# Patient Record
Sex: Male | Born: 1955 | Race: White | Hispanic: No | Marital: Married | Smoking: Never smoker
Health system: Southern US, Community
[De-identification: ages and names within clinical notes are randomized; demographics above are authoritative.]

## PROBLEM LIST (undated history)

## (undated) DIAGNOSIS — E785 Hyperlipidemia, unspecified: Secondary | ICD-10-CM

## (undated) DIAGNOSIS — F419 Anxiety disorder, unspecified: Secondary | ICD-10-CM

## (undated) HISTORY — DX: Hyperlipidemia, unspecified: E78.5

## (undated) HISTORY — PX: PECTORALIS MAJOR REPAIR: SHX2182

## (undated) HISTORY — PX: OTHER SURGICAL HISTORY: SHX169

---

## 1998-11-11 ENCOUNTER — Emergency Department (HOSPITAL_COMMUNITY): Admission: EM | Admit: 1998-11-11 | Discharge: 1998-11-11 | Payer: Self-pay | Admitting: Emergency Medicine

## 1999-05-25 ENCOUNTER — Encounter: Admission: RE | Admit: 1999-05-25 | Discharge: 1999-07-13 | Payer: Self-pay | Admitting: Orthopaedic Surgery

## 2002-05-19 ENCOUNTER — Encounter: Payer: Self-pay | Admitting: Internal Medicine

## 2002-05-19 ENCOUNTER — Encounter: Admission: RE | Admit: 2002-05-19 | Discharge: 2002-05-19 | Payer: Self-pay | Admitting: Internal Medicine

## 2004-08-12 ENCOUNTER — Emergency Department (HOSPITAL_COMMUNITY): Admission: EM | Admit: 2004-08-12 | Discharge: 2004-08-13 | Payer: Self-pay

## 2004-08-19 ENCOUNTER — Emergency Department (HOSPITAL_COMMUNITY): Admission: EM | Admit: 2004-08-19 | Discharge: 2004-08-19 | Payer: Self-pay | Admitting: Internal Medicine

## 2011-01-03 ENCOUNTER — Ambulatory Visit
Admission: RE | Admit: 2011-01-03 | Discharge: 2011-01-03 | Payer: Self-pay | Source: Home / Self Care | Attending: Sports Medicine | Admitting: Sports Medicine

## 2011-01-03 DIAGNOSIS — M545 Low back pain, unspecified: Secondary | ICD-10-CM | POA: Insufficient documentation

## 2011-01-07 ENCOUNTER — Encounter
Admission: RE | Admit: 2011-01-07 | Discharge: 2011-01-07 | Payer: Self-pay | Source: Home / Self Care | Attending: Sports Medicine | Admitting: Sports Medicine

## 2011-01-31 NOTE — Assessment & Plan Note (Signed)
Summary: NP,LOWER BACK PAIN,MC   Vital Signs:  Patient profile:   55 year old male Height:      71 inches Weight:      195 pounds BMI:     27.30 Pulse rate:   58 / minute BP sitting:   110 / 75  (right arm)  Vitals Entered By: Rochele Pages RN (January 03, 2011 3:13 PM) CC: low back pain    CC:  low back pain .  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  been to [employer] already doing back series from there along with stretches this [place] has improved his back function  yoga slit helpful  exercises help for a few hours then tightness and irritation returns   now biggest limitation is stiffness and pain in low back  Preventive Screening-Counseling & Management  Alcohol-Tobacco     Smoking Status: never  Allergies (verified): No Known Drug Allergies  Social History: Smoking Status:  never  Physical Exam  General:  Well-developed,well-nourished,in no acute distress; alert,appropriate and cooperative throughout examination Msk:  heel, toe and tandem walk norm neg SLR bilat full flexion tight lat bending good rotation back extension limited to 20 deg and is uncomfortable FABER is limited bilat SI joints seem to move but tight  strength good without deficits   Impression & Recommendations:  Problem # 1:  LUMBAGO (ICD-724.2)  Orders: Diagnostic X-Ray/Fluoroscopy (Diagnostic X-Ray/Flu)  My concern is that he has a chronic condition that prob has an element of DDD and DJD will ck Xrays as history of back pain when young makes me concerned about spondylolisthesis  will prob need either amitriptyline at night or this w combo of tramadol in day  does well w ibuprofen so will try more regular dose of this first  will reck depending results of Xray  Complete Medication List: 1)  Paxil 20 Mg Tabs  (Paroxetine hcl) .... Take 1 by mouth once daily 2)  Propecia 1 Mg Tabs (Finasteride) .... Take 1 by mouth once daily 3)  Simvastatin 20 Mg Tabs (Simvastatin) .Marland Kitchen.. 1 by mouth qhs   Orders Added: 1)  Diagnostic X-Ray/Fluoroscopy [Diagnostic X-Ray/Flu] 2)  New Patient Level II [78295]

## 2011-02-11 ENCOUNTER — Ambulatory Visit (INDEPENDENT_AMBULATORY_CARE_PROVIDER_SITE_OTHER): Payer: BC Managed Care – PPO | Admitting: Family Medicine

## 2011-02-11 ENCOUNTER — Encounter: Payer: Self-pay | Admitting: Family Medicine

## 2011-02-11 DIAGNOSIS — S83419A Sprain of medial collateral ligament of unspecified knee, initial encounter: Secondary | ICD-10-CM

## 2011-02-20 NOTE — Assessment & Plan Note (Signed)
Summary: rt knee pain   Vital Signs:  Patient profile:   55 year old male Pulse rate:   61 / minute BP sitting:   91 / 64  Vitals Entered By: Lillia Pauls CMA (February 11, 2011 2:28 PM) CC: rt knee pain - injured during hockey   CC:  rt knee pain - injured during hockey.  History of Present Illness: right knee pain 1 month--fall while playing ice hockey. finished game and has been snow skiing since--has had pain with rolling over in bed or certain motions--getting better. Wants it checked out no prior knee injuries  Current Medications (verified): 1)  Paxil 20 Mg Tabs (Paroxetine Hcl) .... Take 1 By Mouth Once Daily 2)  Propecia 1 Mg Tabs (Finasteride) .... Take 1 By Mouth Once Daily 3)  Simvastatin 20 Mg Tabs (Simvastatin) .Marland Kitchen.. 1 By Mouth Qhs  Allergies: No Known Drug Allergies  Review of Systems  The patient denies fever.    Physical Exam  General:  alert, well-developed, well-nourished, and well-hydrated.   Msk:  R knee ttp over MCL. Very slight amount of opening on valgus stress (2 mm compared with other side). Ligamentously intact oyttherwise. noeffusion. FROM medial and lateral joint line nontender to palpation Additional Exam:  [place] (notcharged for this exam) small amount of edema around mcl but it apperas intact. quad and patellar tendons intact no nknee effusioon   Impression & Recommendations:  Problem # 1:  KNEE SPRAIN, RIGHT, MEDIAL COLLATERAL LIGAMENT (ICD-844.1) continue current brace given general knee rehab / strrengthening program rtc prn  Complete Medication List: 1)  Paxil 20 Mg Tabs (Paroxetine hcl) .... Take 1 by mouth once daily 2)  Propecia 1 Mg Tabs (Finasteride) .... Take 1 by mouth once daily 3)  Simvastatin 20 Mg Tabs (Simvastatin) .Marland Kitchen.. 1 by mouth qhs   Orders Added: 1)  Est. Patient Level III [16109]

## 2011-09-19 ENCOUNTER — Encounter: Payer: Self-pay | Admitting: Family Medicine

## 2011-09-19 ENCOUNTER — Ambulatory Visit (HOSPITAL_BASED_OUTPATIENT_CLINIC_OR_DEPARTMENT_OTHER)
Admission: RE | Admit: 2011-09-19 | Discharge: 2011-09-19 | Disposition: A | Discharge status: Home / Self Care | Payer: BC Managed Care – PPO | Source: Ambulatory Visit | Attending: Family Medicine | Admitting: Family Medicine

## 2011-09-19 ENCOUNTER — Ambulatory Visit (INDEPENDENT_AMBULATORY_CARE_PROVIDER_SITE_OTHER): Payer: BC Managed Care – PPO | Admitting: Family Medicine

## 2011-09-19 VITALS — BP 115/73 | HR 51 | Temp 97.6°F | Ht 71.0 in | Wt 194.2 lb

## 2011-09-19 DIAGNOSIS — M25529 Pain in unspecified elbow: Secondary | ICD-10-CM

## 2011-09-19 DIAGNOSIS — M25521 Pain in right elbow: Secondary | ICD-10-CM

## 2011-09-19 DIAGNOSIS — X58XXXA Exposure to other specified factors, initial encounter: Secondary | ICD-10-CM | POA: Insufficient documentation

## 2011-09-19 DIAGNOSIS — S42453A Displaced fracture of lateral condyle of unspecified humerus, initial encounter for closed fracture: Secondary | ICD-10-CM | POA: Insufficient documentation

## 2011-09-23 ENCOUNTER — Encounter: Payer: Self-pay | Admitting: Family Medicine

## 2011-09-23 DIAGNOSIS — M25521 Pain in right elbow: Secondary | ICD-10-CM | POA: Insufficient documentation

## 2011-09-23 NOTE — Assessment & Plan Note (Signed)
patient did not suffer a high impact injury or fall.  Only bony tenderness on exam is at radial head but radiographs are negative for a fracture here.  No joint effusion that would suggest a fracture and he is not tender at any of the three locations of questionable avulsion fractures.  Also with full motion which would be unusual with a fracture that would be sustained in elbow in past 1-2 weeks.  Exam and mechanism suggest capsular sprain.  Advised no hockey for 2 weeks, rest elbow from painful activities.  Icing, tylenol/aleve for pain. Use elbow sleeve which has helped him significantly.  If still not improving or worsening over next 2 weeks, advised him would return for evaluation and then consider further diagnostic imaging (likely MRI).  Call with any questions in the meantime.

## 2011-09-23 NOTE — Progress Notes (Signed)
  Subjective:    Patient ID: Brett Payne, male    DOB: 1956-08-07, 55 y.o.   MRN: [id number]  PCP: Dr. Kevan Ny  HPI 55 yo M here for right elbow pain.  Patient reports about 2 weeks ago while playing hockey he was moving toward the boards when he hyperextended his right elbow. Felt some soreness but was able to continue playing. Pain worse the next morning and radiates from elbow down forearm some.  Worst posterolateral elbow. Seems better with movement. 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 Bought an elbow sleeve that provides excellent compression and greatly alleviates the pain. Taking ibuprofen. Not icing or heating.  Past Medical History  Diagnosis Date  . Hyperlipidemia     No current outpatient prescriptions on file prior to visit.    Past Surgical History  Procedure Date  . Pectoralis major repair     No Known Allergies  History   Social History  . Marital Status: Married    Spouse Name: N/A    Number of Children: N/A  . Years of Education: N/A   Occupational History  . Not on file.   Social History Main Topics  . Smoking status: Never Smoker   . Smokeless tobacco: Not on file  . Alcohol Use: Not on file  . Drug Use: Not on file  . Sexually Active: Not on file   Other Topics Concern  . Not on file   Social History Narrative  . No narrative on file    Family History  Problem Relation Age of Onset  . Hyperlipidemia Mother   . Diabetes Brother   . Heart attack Neg Hx   . Hypertension Neg Hx   . Sudden death Neg Hx     BP 115/73  Pulse 51  Temp(Src) 97.6 F (36.4 C) (Oral)  Ht 5\' 11"  (1.803 m)  Wt 194 lb 3.2 oz (88.089 kg)  BMI 27.09 kg/m2  Review of Systems See HPI above.    Objective:   Physical Exam Gen: NAD  R elbow: No gross deformity, swelling, or bruising. TTP at radial head.  No medial/lateral epicondyle, olecranon, or other bony TTP.  Mild TTP  extensor mass of forearm. Full flexion, extension, supination, and pronation of elbow. Able to flex and extend with 5/5 strength. Collateral ligaments intact. NVI distally.     Assessment & Plan:  1. Right elbow pain - patient did not suffer a high impact injury or fall.  Only bony tenderness on exam is at radial head but radiographs are negative for a fracture here.  No joint effusion that would suggest a fracture and he is not tender at any of the three locations of questionable avulsion fractures.  Also with full motion which would be unusual with a fracture that would be sustained in elbow in past 1-2 weeks.  Exam and mechanism suggest capsular sprain.  Advised no hockey for 2 weeks, rest elbow from painful activities.  Icing, tylenol/aleve for pain. Use elbow sleeve which has helped him significantly.  If still not improving or worsening over next 2 weeks, advised him would return for evaluation and then consider further diagnostic imaging (likely MRI).  Call with any questions in the meantime.

## 2011-10-07 ENCOUNTER — Encounter: Payer: Self-pay | Admitting: Family Medicine

## 2011-10-07 ENCOUNTER — Ambulatory Visit (INDEPENDENT_AMBULATORY_CARE_PROVIDER_SITE_OTHER): Payer: BC Managed Care – PPO | Admitting: Family Medicine

## 2011-10-07 VITALS — BP 114/64 | Ht 71.0 in | Wt 185.0 lb

## 2011-10-07 DIAGNOSIS — M25521 Pain in right elbow: Secondary | ICD-10-CM

## 2011-10-07 DIAGNOSIS — M25529 Pain in unspecified elbow: Secondary | ICD-10-CM

## 2011-10-07 NOTE — Patient Instructions (Signed)
Verbal instructions: wait 1-2 weeks before returning to hockey.  Wear elbow sleeve until painless then only with sports for 3-4 weeks. If persists or worsens, swelling/bruising, advised to follow-up for reevaluation

## 2011-10-07 NOTE — Assessment & Plan Note (Addendum)
Consistent with capsular sprain.  Significantly improved to this point with full motion and strength.  Only has pain with certain movements - advised to wait 1-2 weeks before returning to hockey, preferably when pain is completely resolved.  Icing, tylenol/aleve as needed.  Follow-up as needed.

## 2011-10-07 NOTE — Progress Notes (Signed)
  Subjective:    Patient ID: Brett Payne, male    DOB: 12-27-1956, 55 y.o.   MRN: [id number]  PCP: Dr. Kevan Ny  HPI  55 yo M here for f/u right elbow pain.  9/20: Patient reports about 2 weeks ago while playing hockey he was moving toward the boards when he hyperextended his right elbow. Felt some soreness but was able to continue playing. Pain worse the next morning and radiates from elbow down forearm some.  Worst posterolateral elbow. Seems better with movement. 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 Bought an elbow sleeve that provides excellent compression and greatly alleviates the pain. Taking ibuprofen. Not icing or heating.  10/8: Patient reports he is at least 75% improved. Now only occasionally gets pain with certain movements. Pain resolves with elbow sleeve as well. Continues with work which involves a lot of manual labor. No longer icing or taking medications. Has not returned to hockey yet.  Past Medical History  Diagnosis Date  . Hyperlipidemia     Current Outpatient Prescriptions on File Prior to Visit  Medication Sig Dispense Refill  . mometasone (ELOCON) 0.1 % ointment       . PARoxetine (PAXIL) 40 MG tablet       . simvastatin (ZOCOR) 40 MG tablet         Past Surgical History  Procedure Date  . Pectoralis major repair     No Known Allergies  History   Social History  . Marital Status: Married    Spouse Name: N/A    Number of Children: N/A  . Years of Education: N/A   Occupational History  . Not on file.   Social History Main Topics  . Smoking status: Never Smoker   . Smokeless tobacco: Not on file  . Alcohol Use: Not on file  . Drug Use: Not on file  . Sexually Active: Not on file   Other Topics Concern  . Not on file   Social History Narrative  . No narrative on file    Family History  Problem Relation Age of Onset  . Hyperlipidemia Mother   .  Diabetes Brother   . Heart attack Neg Hx   . Hypertension Neg Hx   . Sudden death Neg Hx     BP 114/64  Ht 5\' 11"  (1.803 m)  Wt 185 lb (83.915 kg)  BMI 25.80 kg/m2  Review of Systems  See HPI above.    Objective:   Physical Exam  Gen: NAD  R elbow: No gross deformity, swelling, or bruising. Minimal TTP at radial head.  No medial/lateral epicondyle, olecranon, or other bony TTP.  No TTP extensor mass of forearm. Full flexion, extension, supination, and pronation of elbow. Able to flex and extend with 5/5 strength. Collateral ligaments intact. NVI distally.     Assessment & Plan:  1. Right elbow pain - Consistent with capsular sprain.  Significantly improved to this point with full motion and strength.  Only has pain with certain movements - advised to wait 1-2 weeks before returning to hockey, preferably when pain is completely resolved.  Icing, tylenol/aleve as needed.  Follow-up as needed.

## 2012-03-24 ENCOUNTER — Encounter: Payer: Self-pay | Admitting: Family Medicine

## 2012-03-24 ENCOUNTER — Ambulatory Visit (INDEPENDENT_AMBULATORY_CARE_PROVIDER_SITE_OTHER): Payer: BC Managed Care – PPO | Admitting: Family Medicine

## 2012-03-24 VITALS — BP 110/74 | HR 71 | Temp 98.2°F | Ht 71.0 in | Wt 190.0 lb

## 2012-03-24 DIAGNOSIS — M79609 Pain in unspecified limb: Secondary | ICD-10-CM

## 2012-03-24 DIAGNOSIS — M79676 Pain in unspecified toe(s): Secondary | ICD-10-CM

## 2012-03-24 NOTE — Patient Instructions (Signed)
When you have pain at this joint you're dealing with one of four issues: gout, arthritis, turf toe, synovitis. You do not have gout. 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 Turf toe is the other likely possibility - this and arthritis of that joint are treated similarly. Avoid painful activities when possible though it's ok to continue playing sports - especially ones like hockey, skiing, snowboarding where you are wearing a hard soled shoe to begin with. Ice after activities for 15 minutes at a time. Tylenol 500mg  1-2 tabs three times a day as needed for pain Ibuprofen 600mg  three times a day with food as needed for pain. Capsaicin, flexall 454, and aspercreme are topical medications you can use to help with pain as well (each one is up to 4 times a day). The other hallmark of treatment is trying to immobilize this as much as possible by one of several methods: walking boot, hard-soled shoe (postop shoe), graphite insert or steel shank, temporary inserts with great toe lift/cushion to prevent dorsiflexion of that toe, and/or taping to your 2nd toe. Follow up with me as needed.

## 2012-03-24 NOTE — Progress Notes (Signed)
  Subjective:    Patient ID: Brett Payne, male    DOB: July 07, 1956, 56 y.o.   MRN: [id number]  PCP: Dr. Robley Fries  HPI 56 yo M here for left great toe pain.  Patient denies known injury. He states that over the past few weeks after getting new shoes he started to have great toe pain about the 1st MTP worse with a lot of activity and at the end of the day. No pain during hockey or skiing (has hard shoes with this) but worse after he takes these off. No swelling, redness, prior injury. Son was diagnosed with turf toe previously and he believes this is what he has.  Past Medical History  Diagnosis Date  . Hyperlipidemia     Current Outpatient Prescriptions on File Prior to Visit  Medication Sig Dispense Refill  . mometasone (ELOCON) 0.1 % ointment       . PARoxetine (PAXIL) 40 MG tablet       . simvastatin (ZOCOR) 40 MG tablet         Past Surgical History  Procedure Date  . Pectoralis major repair     No Known Allergies  History   Social History  . Marital Status: Married    Spouse Name: N/A    Number of Children: N/A  . Years of Education: N/A   Occupational History  . Not on file.   Social History Main Topics  . Smoking status: Never Smoker   . Smokeless tobacco: Not on file  . Alcohol Use: Not on file  . Drug Use: Not on file  . Sexually Active: Not on file   Other Topics Concern  . Not on file   Social History Narrative  . No narrative on file    Family History  Problem Relation Age of Onset  . Hyperlipidemia Mother   . Diabetes Brother   . Heart attack Neg Hx   . Hypertension Neg Hx   . Sudden death Neg Hx     BP 110/74  Pulse 71  Temp(Src) 98.2 F (36.8 C) (Oral)  Ht 5\' 11"  (1.803 m)  Wt 190 lb (86.183 kg)  BMI 26.50 kg/m2  Review of Systems See HPI above.    Objective:   Physical Exam Gen: NAD  L foot: No gross deformity, swelling, bruising, erythema or warmth. Mild TTP dorsal aspect of 1st MTP joint.  No other TTP about  foot or ankle. Mod limitation of dorsiflexion of 1st MTP - pain on full dorsiflexion. FROM other digits, ankle. Strength 5/5 ankle motions without pain. NVI distally. No hallux valgus.    Assessment & Plan:  1. 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  He would like to try postop shoe for around the house with toe taping, comforthotics with 1st ray post in running shoes for when he's on his feet a lot - both provided today.  Discussed icing, relative rest, otc meds.  See instructions for further.  No evidence that this is gouty flare.

## 2012-03-24 NOTE — Assessment & Plan Note (Signed)
likely 2/2 arthritic flare of this joint, less likely turf toe (no acute injury).  He would like to try postop shoe for around the house with toe taping, comforthotics with 1st ray post in running shoes for when he's on his feet a lot - both provided today.  Discussed icing, relative rest, otc meds.  See instructions for further.  No evidence that this is gouty flare.

## 2012-06-02 ENCOUNTER — Ambulatory Visit (INDEPENDENT_AMBULATORY_CARE_PROVIDER_SITE_OTHER): Payer: BC Managed Care – PPO | Admitting: Family Medicine

## 2012-06-02 VITALS — BP 98/60 | Ht 71.0 in | Wt 190.0 lb

## 2012-06-02 DIAGNOSIS — S86919A Strain of unspecified muscle(s) and tendon(s) at lower leg level, unspecified leg, initial encounter: Secondary | ICD-10-CM

## 2012-06-02 DIAGNOSIS — IMO0002 Reserved for concepts with insufficient information to code with codable children: Secondary | ICD-10-CM

## 2012-06-02 NOTE — Patient Instructions (Signed)
Treadmill: Lateral Calf Rehab  Forward Walking: Go light 2 mins, easy about 2-3 mph: At Sideways Left: 2 mins, 0.6 - 0.8 mph Sideways Right: 2 mins, 0.6 - 0.8 mph Backwards, 2 mins, 1.8 - 2.2 mph Repeat, several cycles Goal is 30 minute  Start at a 3 degree incline - can slightly lower if necessary When able, increase to 3.5, then 4, then 4.5 (After you comfortably can do 30 minutes)  When you can do this fully without pain, then can do some light running but start out easy  Running progression  10) Start with 20 min walk 1 min /run 1 min  11) Advance by 5 mins per week  12. Advance to running 2 min, walk 1 min. Then 3/1, 4/1, 5/1 -- All as tolerated based on pain.  Increase total running volume by no more than 10% per week  12) Please schedule a follow-up appointment in 1 month.

## 2012-06-03 NOTE — Progress Notes (Signed)
   [facility] [address] [place], [place] [address] Phone: [phone] Fax: [phone]   Patient Name: Brett Payne Lacks Date of Birth: 07-05-1956 Medical Record Number: [id number] Gender: male Date of Encounter: 06/02/2012  History of Present Illness:  Brylin Stanislawski Mcphillips is a 56 y.o. very pleasant male patient who presents with the following:  Pleasant gentleman presents with lateral LE pain, with running that began about 3 weeks ago. Also plays hockey which does not bother it. Not a specific injury per se, but will have episodes where will be ok for a mile or so, then feel a catch and have difficulty finishing his run.   Mostly all lateral, not in knee, not in posterior of knee. No prob on uneven surfaces.  Had a turf toe a few months ago, which resolved with a corticosteroid injection in the MTP joint.  Past Medical, Surgical, Social, and Family History Reviewed. Medications and Allergies reviewed and all updated if necessary.  Review of Systems:   GEN: No fevers, chills. Nontoxic. Primarily MSK c/o today. MSK: Detailed in the HPI GI: tolerating PO intake without difficulty Neuro: No numbness, parasthesias, or tingling associated. Otherwise the pertinent positives of the ROS are noted above.    Physical Examination: Filed Vitals:   06/02/12 1512  BP: 98/60   Filed Vitals:   06/02/12 1512  Height: 5\' 11"  (1.803 m)  Weight: 190 lb (86.183 kg)   Body mass index is 26.50 kg/(m^2).   GEN: WDWN, NAD, Non-toxic, Alert & Oriented x 3 HEENT: Atraumatic, Normocephalic.  Ears and Nose: No external deformity. EXTR: No clubbing/cyanosis/edema NEURO: Normal gait.  PSYCH: Normally interactive. Conversant. Not depressed or anxious appearing.  Calm demeanor.    Ant and Post drawer: neg Hip abduction, IR, ER: WNL Hip flexion str: 5/5 Hip abd: 5/5 Quad: 5/5 VMO atrophy:No Hamstring concentric and eccentric: 5/5 IROM and EROM resisted  5/5 with no pain  Lateral aspect of LE ttp in distinct muscle plane.   Assessment and Plan:  1. Muscle strain, lower leg    Reviewed rehab with him Lateral stability musculature Gait fairly normal  Rehab per p/i  Orders Today: No orders of the defined types were placed in this encounter.    Medications Today: No orders of the defined types were placed in this encounter.     Hannah Beat, MD

## 2012-06-25 ENCOUNTER — Ambulatory Visit (INDEPENDENT_AMBULATORY_CARE_PROVIDER_SITE_OTHER): Payer: BC Managed Care – PPO | Admitting: Family Medicine

## 2012-06-25 ENCOUNTER — Encounter: Payer: Self-pay | Admitting: Family Medicine

## 2012-06-25 VITALS — BP 106/66 | HR 62 | Ht 71.0 in | Wt 190.0 lb

## 2012-06-25 DIAGNOSIS — S86111A Strain of other muscle(s) and tendon(s) of posterior muscle group at lower leg level, right leg, initial encounter: Secondary | ICD-10-CM

## 2012-06-25 DIAGNOSIS — S838X9A Sprain of other specified parts of unspecified knee, initial encounter: Secondary | ICD-10-CM

## 2012-06-25 NOTE — Progress Notes (Signed)
  Subjective:    Patient ID: Brett Payne, male    DOB: 1956-09-14, 56 y.o.   MRN: [id number]  HPI  Mr. Brett Payne is a pleasant 56 year old male patient, come in today complaining of right calf which started while he was running with his wife last Saturday. He has not been running in a month and he decide to go running Saturday and afterwards mild he felt a sudden severe pain on the lateral calf of his right leg, he felt that the pain improved continue running and he continued running for another 3 miles. The pain got worse. He denies any numbness or tingling. He denies any hematomas or swelling of the right calf. He denies any prior injuries in the calf. The pain is sharp, 410 intensity, on and off, worse with weightbearing activity and walking. Improved by rest.  Patient Active Problem List  Diagnosis  . LUMBAGO  . KNEE SPRAIN, RIGHT, MEDIAL COLLATERAL LIGAMENT  . Right elbow pain  . Toe pain   Current Outpatient Prescriptions on File Prior to Visit  Medication Sig Dispense Refill  . mometasone (ELOCON) 0.1 % ointment       . PARoxetine (PAXIL) 40 MG tablet       . simvastatin (ZOCOR) 40 MG tablet        No Known Allergies    Review of Systems  Constitutional: Negative for fever, chills, diaphoresis and fatigue.  Cardiovascular: Negative for chest pain and leg swelling.  Musculoskeletal: Positive for gait problem.  Neurological: Negative for weakness and numbness.       Objective:   Physical Exam  Constitutional: He is oriented to person, place, and time. He appears well-developed and well-nourished.       BP 106/66  Pulse 62  Ht 5\' 11"  (1.803 m)  Wt 190 lb (86.183 kg)  BMI 26.50 kg/m2   Pulmonary/Chest: Effort normal.  Musculoskeletal:       Right leg and intact the skin There is tenderness to the patient in the mid third of the posterior lateral leg. Achilles tendon is intact. Thompson test is negative Pain with resisted month of flexion of the right  ankle. Neurovascularly intact  Neurological: He is alert and oriented to person, place, and time. He has normal reflexes.  Skin: Skin is warm. No rash noted. No erythema.  Psychiatric: He has a normal mood and affect. His behavior is normal. Thought content normal.    msk u/s : Right lateral gastroc with a 3 x 3 cm hypoechoic area on the lateral gastroc with a strain and tear of the gastroc muscle fibers. The Achilles tendon is intact. Medial gastroc is intact.      Assessment & Plan:   1. Strain of right gastrocnemius muscle    Ice massage x 20 minutes twice a day Body HELIX compression calf sleeve Rest From running for 2- 3 weeks Calf eccentric strengthening exercise protocol given to the patient Heel Lift place on both shoes for 4 weeks. Aleeve for pain control prn. F/U in 4 weeks prn.

## 2012-07-01 ENCOUNTER — Ambulatory Visit: Payer: BC Managed Care – PPO | Admitting: Sports Medicine

## 2013-10-06 ENCOUNTER — Encounter: Payer: Self-pay | Admitting: Podiatry

## 2013-10-07 ENCOUNTER — Encounter: Payer: Self-pay | Admitting: Podiatry

## 2013-10-07 ENCOUNTER — Ambulatory Visit (INDEPENDENT_AMBULATORY_CARE_PROVIDER_SITE_OTHER): Payer: BC Managed Care – PPO | Admitting: Podiatry

## 2013-10-07 ENCOUNTER — Ambulatory Visit (INDEPENDENT_AMBULATORY_CARE_PROVIDER_SITE_OTHER): Payer: BC Managed Care – PPO

## 2013-10-07 VITALS — BP 100/70 | HR 66 | Resp 12 | Ht 71.0 in | Wt 195.0 lb

## 2013-10-07 DIAGNOSIS — Z9889 Other specified postprocedural states: Secondary | ICD-10-CM

## 2013-10-07 DIAGNOSIS — Z472 Encounter for removal of internal fixation device: Secondary | ICD-10-CM

## 2013-10-07 NOTE — Progress Notes (Signed)
Subjective:     Patient ID: Brett Payne, male   DOB: Jan 29, 1956, 57 y.o.   MRN: [id number]  HPI patient presents stating my foot is okay. States he is not having pain.   Review of Systems  All other systems reviewed and are negative.       Objective:   Physical Exam  Constitutional: He is oriented to person, place, and time.  Cardiovascular: Intact distal pulses.   Neurological: He is oriented to person, place, and time.  Skin: Skin is warm.   Incision site dorsal left foot healing well stitches intact.    Assessment:     Well-healing surgical site left.    Plan:     X-rays reviewed. Stitches removed. Sterile dressing applied to the area which she will wear for one more week. Discharge and less problems

## 2014-04-05 ENCOUNTER — Other Ambulatory Visit: Payer: Self-pay

## 2014-04-05 ENCOUNTER — Ambulatory Visit (INDEPENDENT_AMBULATORY_CARE_PROVIDER_SITE_OTHER): Payer: BC Managed Care – PPO | Admitting: Physician Assistant

## 2014-04-05 ENCOUNTER — Encounter: Payer: Self-pay | Admitting: Interventional Cardiology

## 2014-04-05 DIAGNOSIS — R0609 Other forms of dyspnea: Secondary | ICD-10-CM

## 2014-04-05 DIAGNOSIS — R0989 Other specified symptoms and signs involving the circulatory and respiratory systems: Secondary | ICD-10-CM

## 2014-04-05 NOTE — Progress Notes (Signed)
Exercise Treadmill Test  Brett AlmRobert A Smaldone is a 58 y.o. male with a hx of HL is referred for ETT.  He denies a hx of HTN, DM, tobacco abuse.  No FHx of CAD.  He requested an ETT for screening purposes.  Exam is unremarkable.  ECG:  NSR, no ST changes.  Pre-Exercise Testing Evaluation Rhythm: normal sinus  Rate: 60 bpm     Test  Exercise Tolerance Test Ordering MD: Verdis PrimeHenry Smith, MD  Interpreting MD: Tereso NewcomerScott Mattye Verdone, PA-C  Unique Test No: 1  Treadmill:  1  Indication for ETT: exertional dyspnea  Contraindication to ETT: No   Stress Modality: exercise - treadmill  Cardiac Imaging Performed: non   Protocol: standard Bruce - maximal  Max BP:  135/75  Max MPHR (bpm):  164 85% MPR (bpm):  139  MPHR obtained (bpm):  142 % MPHR obtained:  86  Reached 85% MPHR (min:sec):  12:10 Total Exercise Time (min-sec):  13:06  Workload in METS:  15.4 Borg Scale: 15  Reason ETT Terminated:  patient's desire to stop    ST Segment Analysis At Rest: normal ST segments - no evidence of significant ST depression With Exercise: non-specific ST changes  Other Information Arrhythmia:  No Angina during ETT:  absent (0) Quality of ETT:  diagnostic  ETT Interpretation:  normal - no evidence of ischemia by ST analysis  Comments: Excellent exercise capacity. No chest pain. Normal BP response to exercise. No ST changes to suggest ischemia.   Recommendations: F/u with GATES,Oluwatobi NEVILL, MD as directed. Signed,  Tereso NewcomerScott Aviyanna Colbaugh, PA-C   04/05/2014 11:23 AM

## 2016-02-27 ENCOUNTER — Encounter: Payer: Self-pay | Admitting: Family Medicine

## 2016-02-27 ENCOUNTER — Ambulatory Visit (INDEPENDENT_AMBULATORY_CARE_PROVIDER_SITE_OTHER): Payer: BLUE CROSS/BLUE SHIELD | Admitting: Family Medicine

## 2016-02-27 VITALS — BP 103/74 | HR 69 | Ht 71.0 in | Wt 195.0 lb

## 2016-02-27 DIAGNOSIS — M25561 Pain in right knee: Secondary | ICD-10-CM

## 2016-02-27 DIAGNOSIS — M6580 Other synovitis and tenosynovitis, unspecified site: Secondary | ICD-10-CM | POA: Diagnosis not present

## 2016-02-27 DIAGNOSIS — M76899 Other specified enthesopathies of unspecified lower limb, excluding foot: Secondary | ICD-10-CM

## 2016-02-27 NOTE — Progress Notes (Signed)
Thedford Bunton Spallone - 60 y.o. male MRN [id number]  Date of birth: [birth year]  CC:  Right medial knee pain  SUBJECTIVE:   HPI  Mr. Sabino Dick linger is a very pleasant 60 year old male  Who runs about 4 times a week , about 2 miles from. He comes in 3 weeks after experiencing acute onset right knee pain following a run. He has tried various  Treatment modalities including occasional ibuprofen, icing twice a day, and bracing with a hinge knee brace. He feels relatively no pain when he awakes in the morning. As he goes through his day of the pain progressively worsens. He has not been running. He recently took a long walk with his wife and notice increased pain. He feels as though it is similar to a strain although he does not remember any significant injury. He does like to Trail run,  Which is what he was doing prior to the injury. He otherwise feels well with no fevers chills or night sweats. Later this week he is heading to [place] to go skiing.  ROS:      14 point review of systems negative other than that listed above in history of present illness regards to musculoskeletal complaint. Denies any significant fevers chills or night sweats. Denies any new rash.  HISTORY: Past Medical, Surgical, Social, and Family History Reviewed & Updated per EMR.  Pertinent Historical Findings include: negative for any previous knee injuries.  OBJECTIVE: BP 103/74 mmHg  Pulse 69  Ht  (1.803 m)  Wt 195 lb (88.451 kg)  BMI 27.21 kg/m2  Physical Exam   calm, no acute distress  nonlabored breathing  Knee: right Normal to inspection with no erythema or effusion or obvious bony abnormalities.  no joint line tenderness no medial or lateral patellar tenderness. Patient is sore 2-3 cm proximal from the medial anterior joint line along the medial femoral condyle. No bruising or soft tissue swelling identified. ROM normal in flexion and extension and lower leg rotation. Ligaments with solid consistent  endpoints including ACL, PCL, LCL, MCL. Negative Mcmurray's and  Thessaly  Non painful patellar compression. Patellar and quadriceps tendons unremarkable. Mild pain with resisted straight leg raise with external hip rotation. Mild pain with resisted hip adduction    ultrasound: Long and short axis views of the right knee were obtained and there is a mild to moderate sized joint effusion.  The quad and patellar tendons are unremarkable. Medial joint line has a slightly narrowed joint space with an adequately sized and uniformly echoic medial meniscus without any surrounding edema. Evaluation of the medial femoral condyle in the region of history and her internist does not reveal an obvious abnormality. There are several areas of hypoechoic change and increased vascularity potentially suspicious for a soft tissue injury.  MEDICATIONS, LABS & OTHER ORDERS: Previous Medications   FINASTERIDE (PROPECIA) 1 MG TABLET    Take by mouth.   MOMETASONE (ELOCON) 0.1 % OINTMENT       PAROXETINE (PAXIL) 40 MG TABLET       SIMVASTATIN (ZOCOR) 40 MG TABLET       Modified Medications   No medications on file   New Prescriptions   No medications on file   Discontinued Medications   No medications on file  No orders of the defined types were placed in this encounter.   ASSESSMENT & PLAN:  right knee pain : patient is a frequent runner who presents with a unique set of symptoms which seem consistent  with an adductor magnus strain. He did not have an acute injury but developed knee pain following a run around 3 weeks ago. Since that time he has not improved  despite relative rest,  Icing, and bracing. She is leaving for  [place] to go skiing later this week.  We have recommended he wear at least his hinged knee brace skiing, although there is risk he exacerbates his injury if he chooses to ski. He will wear his compression brace  at other times. He will work on hip adductor stretching and strengthening.  He will follow-up after his ski trip if she is having continued pain. At that time we can consider an intra-articular steroid injection to help with diagnosis as he did have a small effusion of uncertain significance.

## 2016-03-13 ENCOUNTER — Encounter: Payer: Self-pay | Admitting: Family Medicine

## 2016-03-13 ENCOUNTER — Ambulatory Visit (INDEPENDENT_AMBULATORY_CARE_PROVIDER_SITE_OTHER): Payer: BLUE CROSS/BLUE SHIELD | Admitting: Family Medicine

## 2016-03-13 VITALS — BP 109/76 | HR 57 | Ht 71.0 in | Wt 195.0 lb

## 2016-03-13 DIAGNOSIS — R269 Unspecified abnormalities of gait and mobility: Secondary | ICD-10-CM | POA: Diagnosis not present

## 2016-03-13 DIAGNOSIS — M23206 Derangement of unspecified meniscus due to old tear or injury, right knee: Secondary | ICD-10-CM

## 2016-03-13 DIAGNOSIS — M23306 Other meniscus derangements, unspecified meniscus, right knee: Secondary | ICD-10-CM

## 2016-03-13 NOTE — Assessment & Plan Note (Signed)
Obvious tear (degenerative) on ultrasound today and provocative testing is positive. -Expect this will respond well to conservative management as he has minimal swelling and minimal pain until deep flexion. -Continue with compression ice and Advil and activity modification. -If still having symptoms and to 3 weeks would recommend corticosteroid injection.

## 2016-03-13 NOTE — Assessment & Plan Note (Signed)
    Patient was fitted for a : standard, cushioned, semi-rigid orthotic. The orthotic was heated and afterward the patient stood on the orthotic blank positioned on the orthotic stand. The patient was positioned in subtalar neutral position and 10 degrees of ankle dorsiflexion in a weight bearing stance. After completion of molding, a stable base was applied to the orthotic blank. The blank was ground to a stable position for weight bearing. Size: 11 Base: Blue EVA Additional Posting and Padding: None The patient ambulated these, and they were very comfortable.  I spent 40 minutes with this patient, greater than 50% was face-to-face time counseling regarding the below diagnosis.   

## 2016-03-13 NOTE — Progress Notes (Signed)
  Brett AlmRobert A Marolf - 60 y.o. male MRN [id number]  Date of birth: 05/07/1956 Brett Payne is a 60 y.o. male who presents today for orthotics and R knee pain.  R knee pain = ongoing for 5 weeks now after a run. Began when he went to stand up after a run and had immediate pain in the medial joint. This improved over the following 2 weeks but he had a take time off from running. Denied any frank effusion. No previous injury. Pain is worse any twisting motion. He did go skiing 3 weeks ago was able do this for 2 days but then had an episode where he turned the wrong way and was unable to ski. He continues to have medial joint pain since then and has been unable to run. No paresthesias going down his leg. He has been wearing compression which is helped.  Abnormality of gait - patient with severe medial arch pain secondary to pes cavus. He also has collapse the transverse arch the proximal nail walks.  PMHx - Updated and reviewed.  Contributory factors include: Negative PSHx - Updated and reviewed.  Contributory factors include:  Negative FHx - Updated and reviewed.  Contributory factors include:  Negative Social Hx - Updated and reviewed. Contributory factors include: Retired nonsmoker  Medications - updated and reviewed   ROS Per HPI.  12 point negative other than per HPI.   Exam:  Filed Vitals:   03/13/16 1405  BP: 109/76  Pulse: 57   Gen: NAD, AAO 3 Cardio- RRR Pulm - Normal respiratory effort/rate Skin: No rashes or erythema Extremities: No edema  Vascular: pulses +2 bilateral upper and lower extremity Psych: Normal affect  Knee R:  Normal to inspection with no erythema or effusion or obvious bony abnormalities. TTp medial joint line ROM normal in flexion and extension and lower leg rotation. Ligaments with solid consistent endpoints including ACL, PCL, LCL, MCL. + Mcmurray's and provocative meniscal tests.  Imaging:  Bulging medial meniscus R knee with calcification and  hypoechoic split appearing to be degenerative.  Suprapatellar pouch with minimal hypoechoic/anoechoic fluid as well.

## 2016-03-28 ENCOUNTER — Encounter: Payer: Self-pay | Admitting: Family Medicine

## 2016-03-28 ENCOUNTER — Ambulatory Visit
Admission: RE | Admit: 2016-03-28 | Discharge: 2016-03-28 | Disposition: A | Discharge status: Home / Self Care | Payer: BLUE CROSS/BLUE SHIELD | Source: Ambulatory Visit | Attending: Family Medicine | Admitting: Family Medicine

## 2016-03-28 ENCOUNTER — Ambulatory Visit (INDEPENDENT_AMBULATORY_CARE_PROVIDER_SITE_OTHER): Payer: BLUE CROSS/BLUE SHIELD | Admitting: Family Medicine

## 2016-03-28 VITALS — BP 93/63 | HR 75 | Ht 71.0 in | Wt 195.0 lb

## 2016-03-28 DIAGNOSIS — M23206 Derangement of unspecified meniscus due to old tear or injury, right knee: Secondary | ICD-10-CM

## 2016-03-28 DIAGNOSIS — M23306 Other meniscus derangements, unspecified meniscus, right knee: Secondary | ICD-10-CM

## 2016-03-28 MED ORDER — METHYLPREDNISOLONE ACETATE 40 MG/ML IJ SUSP
40.0000 mg | Freq: Once | INTRAMUSCULAR | Status: AC
  Administered 2016-03-28: 40 mg via INTRA_ARTICULAR

## 2016-03-28 NOTE — Assessment & Plan Note (Signed)
Continues to have pain 2/2 most likely meniscal tear.   - Will set up 4 view x-rays of R knee and MRI to evaluate extent - CSI today - Consider PRP/Stem cell in 4-6 weeks pending results from steroid injection/MRI

## 2016-03-28 NOTE — Progress Notes (Signed)
  Brett Payne - 60 y.o. male MRN [id number]  Date of birth: 02/14/1956 Brett Payne is a 60 y.o. male who presents today for R knee pain.  R knee pain 03/13/16 = ongoing for 5 weeks now after a run. Began when he went to stand up after a run and had immediate pain in the medial joint. This improved over the following 2 weeks but he had a take time off from running. Denied any frank effusion. No previous injury. Pain is worse any twisting motion. He did go skiing 3 weeks ago was able do this for 2 days but then had an episode where he turned the wrong way and was unable to ski. He continues to have medial joint pain since then and has been unable to run. No paresthesias going down his leg. He has been wearing compression which is helped.  R knee meniscus injury 03/28/16 - Pt continues to have pain without any new injury.  Pain worse with flexion of the knee and now having occasional catching in the R medial knee.  Has had some swelling as well.    PMHx - Updated and reviewed.  Contributory factors include: Negative PSHx - Updated and reviewed.  Contributory factors include:  Negative FHx - Updated and reviewed.  Contributory factors include:  Negative Social Hx - Updated and reviewed. Contributory factors include: Retired nonsmoker  Medications - updated and reviewed   ROS Per HPI.  12 point negative other than per HPI.   Exam:  Filed Vitals:   03/28/16 1003  BP: 93/63  Pulse: 75   Gen: NAD, AAO 3 Cardio- RRR Pulm - Normal respiratory effort/rate Skin: No rashes or erythema Extremities: No edema  Vascular: pulses +2 bilateral upper and lower extremity Psych: Normal affect  Knee R:  Normal to inspection with no erythema or effusion or obvious bony abnormalities. TTp medial joint line ROM normal in flexion and extension and lower leg rotation. Ligaments with solid consistent endpoints including ACL, PCL, LCL, MCL. + Mcmurray's and provocative meniscal tests.  Imaging:    Bulging medial meniscus R knee with calcification and hypoechoic split appearing to be degenerative.  Suprapatellar pouch with minimal hypoechoic/anoechoic fluid as well.

## 2016-04-01 DIAGNOSIS — F4321 Adjustment disorder with depressed mood: Secondary | ICD-10-CM | POA: Diagnosis not present

## 2016-04-02 ENCOUNTER — Other Ambulatory Visit: Payer: Self-pay | Admitting: *Deleted

## 2016-04-04 ENCOUNTER — Telehealth: Payer: Self-pay | Admitting: Family Medicine

## 2016-04-04 ENCOUNTER — Ambulatory Visit
Admission: RE | Admit: 2016-04-04 | Discharge: 2016-04-04 | Disposition: A | Discharge status: Home / Self Care | Payer: BLUE CROSS/BLUE SHIELD | Source: Ambulatory Visit | Attending: Family Medicine | Admitting: Family Medicine

## 2016-04-04 DIAGNOSIS — S83411D Sprain of medial collateral ligament of right knee, subsequent encounter: Secondary | ICD-10-CM

## 2016-04-04 DIAGNOSIS — M23306 Other meniscus derangements, unspecified meniscus, right knee: Secondary | ICD-10-CM

## 2016-04-04 DIAGNOSIS — S83241A Other tear of medial meniscus, current injury, right knee, initial encounter: Secondary | ICD-10-CM | POA: Diagnosis not present

## 2016-04-04 NOTE — Telephone Encounter (Signed)
Discussed results with pt.  Referral to ortho for arthroscopy with possible chondroplasty/menisectomy vs partial knee.  Pt amenable to this plan.  Thanks, Judie GrieveBryan

## 2016-04-05 NOTE — Addendum Note (Signed)
Addended by: Annita BrodMOORE, Easter Schinke C on: 04/05/2016 08:51 AM   Modules accepted: Orders

## 2016-04-05 NOTE — Telephone Encounter (Signed)
[facility] Hurshel KeysPete Dalldorf Wed. Apr. 19th at [facility]4pm [address], [place], [place] [address] [phone]

## 2016-04-08 DIAGNOSIS — M25561 Pain in right knee: Secondary | ICD-10-CM | POA: Diagnosis not present

## 2016-04-08 DIAGNOSIS — F4321 Adjustment disorder with depressed mood: Secondary | ICD-10-CM | POA: Diagnosis not present

## 2016-04-15 DIAGNOSIS — F4321 Adjustment disorder with depressed mood: Secondary | ICD-10-CM | POA: Diagnosis not present

## 2016-04-17 DIAGNOSIS — M25561 Pain in right knee: Secondary | ICD-10-CM | POA: Diagnosis not present

## 2016-04-18 ENCOUNTER — Encounter (HOSPITAL_BASED_OUTPATIENT_CLINIC_OR_DEPARTMENT_OTHER): Payer: Self-pay | Admitting: *Deleted

## 2016-04-18 ENCOUNTER — Other Ambulatory Visit: Payer: Self-pay | Admitting: Orthopaedic Surgery

## 2016-04-22 DIAGNOSIS — F4321 Adjustment disorder with depressed mood: Secondary | ICD-10-CM | POA: Diagnosis not present

## 2016-04-22 NOTE — H&P (Signed)
Brett Payne is an 60 y.o. male.    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  He is here about his right knee.  About 3 months ago he developed some pain while running.  This has persisted.  He has a constant severe pain on the inside aspect of the knee.  This does not typically wake him from sleep.  He initially saw Dr. Darrick Penna who did an ultrasound which was suggestive of a meniscus tear.  He received an injection which did not afford him much relief.  He has been treated with a brace.  He eventually underwent an MRI scan which shows a meniscus tear.  Imaging/Tests: I reviewed an MRI scan films and [facility].  This shows a medial meniscus tear in the posterior horn with a paucity of degenerative change.  Past Medical History  Diagnosis Date  . Hyperlipidemia   . Anxiety     Past Surgical History  Procedure Laterality Date  . Pectoralis major repair    . Bone spur      Family History  Problem Relation Age of Onset  . Hyperlipidemia Mother   . Diabetes Brother   . Heart attack Neg Hx   . Hypertension Neg Hx   . Sudden death Neg Hx    Social History:  reports that he has never smoked. He has never used smokeless tobacco. He reports that he does not drink alcohol or use illicit drugs.  Allergies: No Known Allergies  No prescriptions prior to admission    No results found for this or any previous visit (from the past 48 hour(s)). No results found.  Review of Systems  Musculoskeletal: Positive for joint pain.       Right knee  All other systems reviewed and are negative.   Height  (1.803 m), weight 86.183 kg (190 lb). Physical Exam  Constitutional: He is oriented to person, place, and time. He appears well-developed and well-nourished.  HENT:  Head: Normocephalic and atraumatic.  Eyes: Pupils  are equal, round, and reactive to light.  Neck: Normal range of motion.  Cardiovascular: Normal rate and regular rhythm.   Respiratory: Effort normal.  GI: Soft.  Musculoskeletal:  Right knee has no effusion.  He has some intense medial joint line pain.  Motion is full and he has pain at both extremes.  McMurray's test is positive for pain.  I feel no crepitation.  Ligaments are stable.   Hip motion is full and painfree and SLR is negative on both sides.  There is no palpable LAD behind either knee.  Sensation and motor function are intact on both sides and there are palpable pulses on both sides.    Neurological: He is alert and oriented to person, place, and time.  Skin: Skin is warm and dry.  Psychiatric: He has a normal mood and affect. His behavior is normal. Judgment and thought content normal.     Assessment/Plan Assessment: Right knee torn medial meniscus by MRI  Plan: It looks as though Brett Payne is headed towards a knee arthroscopy.  He's had an injection and has worn a brace and has tolerated this for 3 months.  He is unable to run which is what he likes to do for exercise. I reviewed risks of anesthesia and infection as  well as potential for DVT related to a knee arthroscopy.  I've stressed the importance of some postoperative physical therapy to optimize results and we will try to set up an appointment.  Two to four  weeks for recovery would be typical but that is a little variable.    Rayan Ines, Ginger OrganNDREW PAUL, PA-C 04/22/2016, 2:40 PM

## 2016-04-26 ENCOUNTER — Encounter (HOSPITAL_BASED_OUTPATIENT_CLINIC_OR_DEPARTMENT_OTHER)
Admission: RE | Disposition: A | Discharge status: Home / Self Care | Payer: Self-pay | Source: Ambulatory Visit | Attending: Orthopaedic Surgery

## 2016-04-26 ENCOUNTER — Encounter (HOSPITAL_BASED_OUTPATIENT_CLINIC_OR_DEPARTMENT_OTHER): Payer: Self-pay

## 2016-04-26 ENCOUNTER — Ambulatory Visit (HOSPITAL_BASED_OUTPATIENT_CLINIC_OR_DEPARTMENT_OTHER): Payer: BLUE CROSS/BLUE SHIELD | Admitting: Certified Registered"

## 2016-04-26 ENCOUNTER — Ambulatory Visit (HOSPITAL_BASED_OUTPATIENT_CLINIC_OR_DEPARTMENT_OTHER)
Admission: RE | Admit: 2016-04-26 | Discharge: 2016-04-26 | Disposition: A | Discharge status: Home / Self Care | Payer: BLUE CROSS/BLUE SHIELD | Source: Ambulatory Visit | Attending: Orthopaedic Surgery | Admitting: Orthopaedic Surgery

## 2016-04-26 DIAGNOSIS — Z79899 Other long term (current) drug therapy: Secondary | ICD-10-CM | POA: Insufficient documentation

## 2016-04-26 DIAGNOSIS — E785 Hyperlipidemia, unspecified: Secondary | ICD-10-CM | POA: Diagnosis not present

## 2016-04-26 DIAGNOSIS — X58XXXA Exposure to other specified factors, initial encounter: Secondary | ICD-10-CM | POA: Diagnosis not present

## 2016-04-26 DIAGNOSIS — S83241A Other tear of medial meniscus, current injury, right knee, initial encounter: Secondary | ICD-10-CM | POA: Diagnosis not present

## 2016-04-26 DIAGNOSIS — M2241 Chondromalacia patellae, right knee: Secondary | ICD-10-CM | POA: Diagnosis not present

## 2016-04-26 DIAGNOSIS — M94261 Chondromalacia, right knee: Secondary | ICD-10-CM | POA: Diagnosis not present

## 2016-04-26 HISTORY — DX: Anxiety disorder, unspecified: F41.9

## 2016-04-26 HISTORY — PX: KNEE ARTHROSCOPY WITH MEDIAL MENISECTOMY: SHX5651

## 2016-04-26 HISTORY — PX: CHONDROPLASTY: SHX5177

## 2016-04-26 SURGERY — ARTHROSCOPY, KNEE, WITH MEDIAL MENISCECTOMY
Anesthesia: General | Site: Knee | Laterality: Right

## 2016-04-26 MED ORDER — MIDAZOLAM HCL 5 MG/5ML IJ SOLN
INTRAMUSCULAR | Status: DC | PRN
  Administered 2016-04-26: 2 mg via INTRAVENOUS

## 2016-04-26 MED ORDER — BUPIVACAINE HCL (PF) 0.5 % IJ SOLN
INTRAMUSCULAR | Status: AC
  Filled 2016-04-26: qty 30

## 2016-04-26 MED ORDER — BUPIVACAINE-EPINEPHRINE (PF) 0.5% -1:200000 IJ SOLN
INTRAMUSCULAR | Status: AC
  Filled 2016-04-26: qty 30

## 2016-04-26 MED ORDER — FENTANYL CITRATE (PF) 100 MCG/2ML IJ SOLN
50.0000 ug | INTRAMUSCULAR | Status: DC | PRN
  Administered 2016-04-26: 50 ug via INTRAVENOUS

## 2016-04-26 MED ORDER — FENTANYL CITRATE (PF) 100 MCG/2ML IJ SOLN
INTRAMUSCULAR | Status: AC
  Filled 2016-04-26: qty 2

## 2016-04-26 MED ORDER — DEXAMETHASONE SODIUM PHOSPHATE 4 MG/ML IJ SOLN
INTRAMUSCULAR | Status: DC | PRN
  Administered 2016-04-26: 10 mg via INTRAVENOUS

## 2016-04-26 MED ORDER — CEFAZOLIN SODIUM-DEXTROSE 2-4 GM/100ML-% IV SOLN
2.0000 g | INTRAVENOUS | Status: AC
  Administered 2016-04-26: 2 g via INTRAVENOUS

## 2016-04-26 MED ORDER — PROPOFOL 10 MG/ML IV BOLUS
INTRAVENOUS | Status: DC | PRN
  Administered 2016-04-26: 200 mg via INTRAVENOUS

## 2016-04-26 MED ORDER — GLYCOPYRROLATE 0.2 MG/ML IJ SOLN: 0.2000 mg | Freq: Once | INTRAMUSCULAR | Status: DC | PRN

## 2016-04-26 MED ORDER — DEXAMETHASONE SODIUM PHOSPHATE 10 MG/ML IJ SOLN
INTRAMUSCULAR | Status: AC
  Filled 2016-04-26: qty 1

## 2016-04-26 MED ORDER — HYDROMORPHONE HCL 1 MG/ML IJ SOLN
0.2500 mg | INTRAMUSCULAR | Status: DC | PRN
  Administered 2016-04-26 (×2): 0.5 mg via INTRAVENOUS

## 2016-04-26 MED ORDER — HYDROMORPHONE HCL 1 MG/ML IJ SOLN
INTRAMUSCULAR | Status: AC
  Filled 2016-04-26: qty 1

## 2016-04-26 MED ORDER — SCOPOLAMINE 1 MG/3DAYS TD PT72: 1.0000 | MEDICATED_PATCH | Freq: Once | TRANSDERMAL | Status: DC | PRN

## 2016-04-26 MED ORDER — METHYLPREDNISOLONE ACETATE 40 MG/ML IJ SUSP
INTRAMUSCULAR | Status: AC
  Filled 2016-04-26: qty 1

## 2016-04-26 MED ORDER — LACTATED RINGERS IV SOLN: INTRAVENOUS | Status: DC

## 2016-04-26 MED ORDER — METHYLPREDNISOLONE ACETATE 80 MG/ML IJ SUSP
INTRAMUSCULAR | Status: AC
  Filled 2016-04-26: qty 1

## 2016-04-26 MED ORDER — BUPIVACAINE-EPINEPHRINE 0.5% -1:200000 IJ SOLN
INTRAMUSCULAR | Status: DC | PRN
  Administered 2016-04-26: 6 mL

## 2016-04-26 MED ORDER — ONDANSETRON HCL 4 MG/2ML IJ SOLN
INTRAMUSCULAR | Status: AC
  Filled 2016-04-26: qty 2

## 2016-04-26 MED ORDER — LIDOCAINE HCL (CARDIAC) 20 MG/ML IV SOLN
INTRAVENOUS | Status: DC | PRN
  Administered 2016-04-26: 100 mg via INTRAVENOUS

## 2016-04-26 MED ORDER — HYDROCODONE-ACETAMINOPHEN 5-325 MG PO TABS
ORAL_TABLET | ORAL | Status: AC
  Filled 2016-04-26: qty 1

## 2016-04-26 MED ORDER — MORPHINE SULFATE (PF) 4 MG/ML IV SOLN
INTRAVENOUS | Status: DC | PRN
  Administered 2016-04-26: 4 mg

## 2016-04-26 MED ORDER — PROPOFOL 10 MG/ML IV BOLUS
INTRAVENOUS | Status: AC
  Filled 2016-04-26: qty 80

## 2016-04-26 MED ORDER — MIDAZOLAM HCL 2 MG/2ML IJ SOLN
INTRAMUSCULAR | Status: AC
  Filled 2016-04-26: qty 2

## 2016-04-26 MED ORDER — KETOROLAC TROMETHAMINE 30 MG/ML IJ SOLN
INTRAMUSCULAR | Status: AC
  Filled 2016-04-26: qty 1

## 2016-04-26 MED ORDER — MIDAZOLAM HCL 2 MG/2ML IJ SOLN: 1.0000 mg | INTRAMUSCULAR | Status: DC | PRN

## 2016-04-26 MED ORDER — CHLORHEXIDINE GLUCONATE 4 % EX LIQD: 60.0000 mL | Freq: Once | CUTANEOUS | Status: DC

## 2016-04-26 MED ORDER — PROMETHAZINE HCL 25 MG/ML IJ SOLN: 6.2500 mg | INTRAMUSCULAR | Status: DC | PRN

## 2016-04-26 MED ORDER — LACTATED RINGERS IV SOLN
INTRAVENOUS | Status: DC
  Administered 2016-04-26 (×2): via INTRAVENOUS

## 2016-04-26 MED ORDER — HYDROCODONE-ACETAMINOPHEN 5-325 MG PO TABS
1.0000 | ORAL_TABLET | Freq: Four times a day (QID) | ORAL | Status: DC | PRN
  Administered 2016-04-26: 1 via ORAL

## 2016-04-26 MED ORDER — SODIUM CHLORIDE 0.9 % IR SOLN
Status: DC | PRN
  Administered 2016-04-26: 2000 mL

## 2016-04-26 MED ORDER — CEFAZOLIN SODIUM-DEXTROSE 2-4 GM/100ML-% IV SOLN
INTRAVENOUS | Status: AC
  Filled 2016-04-26: qty 100

## 2016-04-26 MED ORDER — HYDROCODONE-ACETAMINOPHEN 5-325 MG PO TABS: 1.0000 | ORAL_TABLET | Freq: Four times a day (QID) | ORAL | Status: AC | PRN

## 2016-04-26 MED ORDER — MORPHINE SULFATE (PF) 4 MG/ML IV SOLN
INTRAVENOUS | Status: AC
  Filled 2016-04-26: qty 1

## 2016-04-26 SURGICAL SUPPLY — 39 items
BANDAGE ACE 6X5 VEL STRL LF (GAUZE/BANDAGES/DRESSINGS) ×3 IMPLANT
BLADE CUDA 5.5 (BLADE) IMPLANT
BLADE CUTTER GATOR 3.5 (BLADE) ×3 IMPLANT
BLADE GREAT WHITE 4.2 (BLADE) IMPLANT
BNDG GAUZE ELAST 4 BULKY (GAUZE/BANDAGES/DRESSINGS) ×3 IMPLANT
DRAPE ARTHROSCOPY W/POUCH 114 (DRAPES) ×3 IMPLANT
DRAPE IMP U-DRAPE 54X76 (DRAPES) ×3 IMPLANT
DRAPE U-SHAPE 47X51 STRL (DRAPES) ×3 IMPLANT
DRSG EMULSION OIL 3X3 NADH (GAUZE/BANDAGES/DRESSINGS) ×3 IMPLANT
DURAPREP 26ML APPLICATOR (WOUND CARE) ×6 IMPLANT
ELECT MENISCUS 165MM 90D (ELECTRODE) IMPLANT
ELECT REM PT RETURN 9FT ADLT (ELECTROSURGICAL)
ELECTRODE REM PT RTRN 9FT ADLT (ELECTROSURGICAL) IMPLANT
GAUZE SPONGE 4X4 12PLY STRL (GAUZE/BANDAGES/DRESSINGS) ×3 IMPLANT
GLOVE BIO SURGEON STRL SZ8 (GLOVE) ×6 IMPLANT
GLOVE BIOGEL PI IND STRL 7.0 (GLOVE) ×2 IMPLANT
GLOVE BIOGEL PI IND STRL 8 (GLOVE) ×4 IMPLANT
GLOVE BIOGEL PI INDICATOR 7.0 (GLOVE) ×2
GLOVE BIOGEL PI INDICATOR 8 (GLOVE) ×2
GLOVE ECLIPSE 6.5 STRL STRAW (GLOVE) ×2 IMPLANT
GOWN STRL REUS W/ TWL LRG LVL3 (GOWN DISPOSABLE) ×2 IMPLANT
GOWN STRL REUS W/ TWL XL LVL3 (GOWN DISPOSABLE) ×4 IMPLANT
GOWN STRL REUS W/TWL LRG LVL3 (GOWN DISPOSABLE) ×3
GOWN STRL REUS W/TWL XL LVL3 (GOWN DISPOSABLE) ×6
IV NS IRRIG 3000ML ARTHROMATIC (IV SOLUTION) ×3 IMPLANT
KNEE WRAP E Z 3 GEL PACK (MISCELLANEOUS) ×3 IMPLANT
MANIFOLD NEPTUNE II (INSTRUMENTS) ×2 IMPLANT
PACK ARTHROSCOPY DSU (CUSTOM PROCEDURE TRAY) ×3 IMPLANT
PACK BASIN DAY SURGERY FS (CUSTOM PROCEDURE TRAY) ×3 IMPLANT
PENCIL BUTTON HOLSTER BLD 10FT (ELECTRODE) IMPLANT
SET ARTHROSCOPY TUBING (MISCELLANEOUS) ×3
SET ARTHROSCOPY TUBING LN (MISCELLANEOUS) ×2 IMPLANT
SHEET MEDIUM DRAPE 40X70 STRL (DRAPES) ×3 IMPLANT
SYR 3ML 18GX1 1/2 (SYRINGE) IMPLANT
TOWEL OR 17X24 6PK STRL BLUE (TOWEL DISPOSABLE) ×3 IMPLANT
TOWEL OR NON WOVEN STRL DISP B (DISPOSABLE) ×3 IMPLANT
WAND 30 DEG SABER W/CORD (SURGICAL WAND) IMPLANT
WAND STAR VAC 90 (SURGICAL WAND) IMPLANT
WATER STERILE IRR 1000ML POUR (IV SOLUTION) ×3 IMPLANT

## 2016-04-26 NOTE — Anesthesia Postprocedure Evaluation (Addendum)
Anesthesia Post Note  Patient: Brett Payne  Procedure(s) Performed: Procedure(s) (LRB): KNEE ARTHROSCOPY WITH MEDIAL MENISECTOMY (Right) CHONDROPLASTY (Right)  Patient location during evaluation: PACU Anesthesia Type: General Level of consciousness: awake Pain management: pain level controlled Respiratory status: spontaneous breathing Cardiovascular status: stable Anesthetic complications: no    Last Vitals:  Filed Vitals:   04/26/16 1430 04/26/16 1453  BP: 100/74 109/74  Pulse: 51 49  Temp:  36.4 C  Resp: 12 18    Last Pain:  Filed Vitals:   04/26/16 1454  PainSc: 4                  EDWARDS,Uri Turnbough

## 2016-04-26 NOTE — Interval H&P Note (Signed)
OK for surgery PD 

## 2016-04-26 NOTE — Op Note (Signed)
#  [id number] 

## 2016-04-26 NOTE — Anesthesia Procedure Notes (Signed)
Procedure Name: LMA Insertion Date/Time: 04/26/2016 1:08 PM Performed by: Tyrone NineSAUVE, Aaron Boeh F Pre-anesthesia Checklist: Patient identified, Timeout performed, Emergency Drugs available, Suction available and Patient being monitored Patient Re-evaluated:Patient Re-evaluated prior to inductionOxygen Delivery Method: Circle system utilized Preoxygenation: Pre-oxygenation with 100% oxygen Intubation Type: IV induction Ventilation: Mask ventilation without difficulty LMA: LMA inserted LMA Size: 4.0 Number of attempts: 1 Placement Confirmation: breath sounds checked- equal and bilateral and positive ETCO2 Tube secured with: Tape Dental Injury: Teeth and Oropharynx as per pre-operative assessment

## 2016-04-26 NOTE — Anesthesia Preprocedure Evaluation (Addendum)
Anesthesia Evaluation  Patient identified by MRN, date of birth, ID band Patient awake    Reviewed: Allergy & Precautions, NPO status , Patient's Chart, lab work & pertinent test results  Airway Mallampati: II  TM Distance: >3 FB Neck ROM: Full    Dental  (+) Teeth Intact, Dental Advisory Given, Chipped,    Pulmonary neg pulmonary ROS,    breath sounds clear to auscultation       Cardiovascular negative cardio ROS   Rhythm:Regular Rate:Normal     Neuro/Psych    GI/Hepatic negative GI ROS, Neg liver ROS,   Endo/Other  negative endocrine ROS  Renal/GU negative Renal ROS     Musculoskeletal negative musculoskeletal ROS (+) Right Med. Meniscus tear   Abdominal   Peds  Hematology  (+) Blood dyscrasia, ,   Anesthesia Other Findings   Reproductive/Obstetrics                         Anesthesia Physical Anesthesia Plan  ASA: II  Anesthesia Plan: General   Post-op Pain Management:    Induction: Intravenous  Airway Management Planned: LMA  Additional Equipment:   Intra-op Plan:   Post-operative Plan:   Informed Consent: I have reviewed the patients History and Physical, chart, labs and discussed the procedure including the risks, benefits and alternatives for the proposed anesthesia with the patient or authorized representative who has indicated his/her understanding and acceptance.   Dental advisory given  Plan Discussed with: CRNA and Anesthesiologist  Anesthesia Plan Comments:         Anesthesia Quick Evaluation

## 2016-04-26 NOTE — Transfer of Care (Signed)
Immediate Anesthesia Transfer of Care Note  Patient: Brett Maduroobert A Payne  Procedure(s) Performed: Procedure(s): KNEE ARTHROSCOPY WITH MEDIAL MENISECTOMY (Right) CHONDROPLASTY (Right)  Patient Location: PACU  Anesthesia Type:General  Level of Consciousness: awake, alert , oriented and patient cooperative  Airway & Oxygen Therapy: Patient Spontanous Breathing and Patient connected to face mask oxygen  Post-op Assessment: Report given to RN and Post -op Vital signs reviewed and stable  Post vital signs: Reviewed and stable  Last Vitals:  Filed Vitals:   04/26/16 1122  BP: 121/77  Pulse: 66  Temp: 36.9 C  Resp: 18    Last Pain: There were no vitals filed for this visit.       Complications: No apparent anesthesia complications

## 2016-04-26 NOTE — Discharge Instructions (Signed)

## 2016-04-29 ENCOUNTER — Encounter (HOSPITAL_BASED_OUTPATIENT_CLINIC_OR_DEPARTMENT_OTHER): Payer: Self-pay | Admitting: Orthopaedic Surgery

## 2016-04-29 DIAGNOSIS — F4321 Adjustment disorder with depressed mood: Secondary | ICD-10-CM | POA: Diagnosis not present

## 2016-04-29 NOTE — Op Note (Signed)
NAME:  Doreatha LewRIEDLINGER, Bart                ACCOUNT NO.:  MEDICAL RECORD NO.:  [id number]  LOCATION:                                 FACILITY:  PHYSICIAN:  Lubertha Basqueeter G. Jerl Santosalldorf, M.D.     DATE OF BIRTH:  DATE OF PROCEDURE:  04/26/2016 DATE OF DISCHARGE:                              OPERATIVE REPORT   PREOPERATIVE DIAGNOSES: 1. Right knee torn medial meniscus. 2. Right knee chondromalacia patella.  POSTOPERATIVE DIAGNOSES: 1. Right knee torn medial meniscus. 2. Right knee chondromalacia patella.  PROCEDURES: 1. Right knee partial meniscectomy. 2. Right knee abrasion chondroplasty patellofemoral.  ANESTHESIA:  General and block.  ATTENDING SURGEON:  Lubertha Basqueeter G. Jerl Santosalldorf, M.D.  ASSISTANT:  Elodia FlorenceAndrew Nida, PA.  INDICATION FOR PROCEDURE:  The patient is a 60 year old man with many months of intense right knee pain.  This has persisted despite oral anti- inflammatories and an injection.  He has pain which limits his ability to rest and walk.  By MRI scan, he has a displaced full medial meniscus tear.  He is offered an arthroscopy.  Informed operative consent was obtained after discussion of possible complications including reaction to anesthesia and infection.  SUMMARY OF FINDINGS AND PROCEDURE:  Under general anesthesia and a block, a right knee arthroscopy was performed.  The suprapatellar pouch was benign while the patellofemoral joint exhibited focal breakdown at the apex of patella, addressed with a chondroplasty and abrasion to bleeding bone in 1 tiny area.  The medial compartment exhibited a displaceable tear of the posterior horn of the medial meniscus, addressed with about 10% partial medial meniscectomy.  He had no degenerative changes here.  The ACL was normal and the lateral compartment exhibited no evidence of meniscal or articular cartilage injury.  He was discharged home the same day.  DESCRIPTION OF PROCEDURE:  The patient was taken to the operating suite, where  general anesthetic was applied without difficulty.  He was positioned supine and prepped and draped in normal sterile fashion. After the administration of preop IV Kefzol and an appropriate time out, an arthroscopy of the right knee was performed through total of 2 portals.  Findings were as noted above.  Procedure consisted predominantly the partial medial meniscectomy done with basket and shaver involving the posterior horn.  We also performed abrasion chondroplasty.  The knee was thoroughly irrigated followed by placement of some Marcaine.  Adaptic was placed over the portals followed by dry gauze and loose Ace wrap.  Estimated blood loss and fluids obtain from anesthesia records.  DISPOSITION:  The patient was extubated in the operating room and taken to recovery in stable condition.  Plans were for him to go home same-day and follow up in the office in less than a week.  I will contact him by phone tonight.     Lubertha BasquePeter G. Jerl Santosalldorf, M.D.     PGD/MEDQ  D:  04/26/2016  T:  04/27/2016  Job:  [id number]

## 2016-05-01 ENCOUNTER — Ambulatory Visit: Payer: BLUE CROSS/BLUE SHIELD | Admitting: Family Medicine

## 2016-05-01 DIAGNOSIS — Z4789 Encounter for other orthopedic aftercare: Secondary | ICD-10-CM | POA: Diagnosis not present

## 2016-05-01 DIAGNOSIS — M25561 Pain in right knee: Secondary | ICD-10-CM | POA: Diagnosis not present

## 2016-05-07 DIAGNOSIS — F4321 Adjustment disorder with depressed mood: Secondary | ICD-10-CM | POA: Diagnosis not present

## 2016-05-08 DIAGNOSIS — M9901 Segmental and somatic dysfunction of cervical region: Secondary | ICD-10-CM | POA: Diagnosis not present

## 2016-05-08 DIAGNOSIS — M9903 Segmental and somatic dysfunction of lumbar region: Secondary | ICD-10-CM | POA: Diagnosis not present

## 2016-05-08 DIAGNOSIS — M9905 Segmental and somatic dysfunction of pelvic region: Secondary | ICD-10-CM | POA: Diagnosis not present

## 2016-05-08 DIAGNOSIS — M9902 Segmental and somatic dysfunction of thoracic region: Secondary | ICD-10-CM | POA: Diagnosis not present

## 2016-05-08 DIAGNOSIS — M791 Myalgia: Secondary | ICD-10-CM | POA: Diagnosis not present

## 2016-05-10 DIAGNOSIS — M9902 Segmental and somatic dysfunction of thoracic region: Secondary | ICD-10-CM | POA: Diagnosis not present

## 2016-05-10 DIAGNOSIS — M9903 Segmental and somatic dysfunction of lumbar region: Secondary | ICD-10-CM | POA: Diagnosis not present

## 2016-05-10 DIAGNOSIS — M9905 Segmental and somatic dysfunction of pelvic region: Secondary | ICD-10-CM | POA: Diagnosis not present

## 2016-05-10 DIAGNOSIS — M9901 Segmental and somatic dysfunction of cervical region: Secondary | ICD-10-CM | POA: Diagnosis not present

## 2016-05-10 DIAGNOSIS — M791 Myalgia: Secondary | ICD-10-CM | POA: Diagnosis not present

## 2016-05-14 DIAGNOSIS — M791 Myalgia: Secondary | ICD-10-CM | POA: Diagnosis not present

## 2016-05-14 DIAGNOSIS — M9901 Segmental and somatic dysfunction of cervical region: Secondary | ICD-10-CM | POA: Diagnosis not present

## 2016-05-14 DIAGNOSIS — M9905 Segmental and somatic dysfunction of pelvic region: Secondary | ICD-10-CM | POA: Diagnosis not present

## 2016-05-14 DIAGNOSIS — M9902 Segmental and somatic dysfunction of thoracic region: Secondary | ICD-10-CM | POA: Diagnosis not present

## 2016-05-14 DIAGNOSIS — M9903 Segmental and somatic dysfunction of lumbar region: Secondary | ICD-10-CM | POA: Diagnosis not present

## 2016-05-16 DIAGNOSIS — F4321 Adjustment disorder with depressed mood: Secondary | ICD-10-CM | POA: Diagnosis not present

## 2016-05-23 DIAGNOSIS — F4321 Adjustment disorder with depressed mood: Secondary | ICD-10-CM | POA: Diagnosis not present

## 2016-06-24 DIAGNOSIS — F4321 Adjustment disorder with depressed mood: Secondary | ICD-10-CM | POA: Diagnosis not present

## 2016-07-05 DIAGNOSIS — M791 Myalgia: Secondary | ICD-10-CM | POA: Diagnosis not present

## 2016-07-05 DIAGNOSIS — M545 Low back pain: Secondary | ICD-10-CM | POA: Diagnosis not present

## 2016-07-05 DIAGNOSIS — M9903 Segmental and somatic dysfunction of lumbar region: Secondary | ICD-10-CM | POA: Diagnosis not present

## 2016-07-05 DIAGNOSIS — M546 Pain in thoracic spine: Secondary | ICD-10-CM | POA: Diagnosis not present

## 2016-07-09 DIAGNOSIS — M545 Low back pain: Secondary | ICD-10-CM | POA: Diagnosis not present

## 2016-07-09 DIAGNOSIS — M546 Pain in thoracic spine: Secondary | ICD-10-CM | POA: Diagnosis not present

## 2016-07-09 DIAGNOSIS — M9903 Segmental and somatic dysfunction of lumbar region: Secondary | ICD-10-CM | POA: Diagnosis not present

## 2016-07-09 DIAGNOSIS — M791 Myalgia: Secondary | ICD-10-CM | POA: Diagnosis not present

## 2016-07-09 DIAGNOSIS — M25552 Pain in left hip: Secondary | ICD-10-CM | POA: Diagnosis not present

## 2016-07-12 DIAGNOSIS — M791 Myalgia: Secondary | ICD-10-CM | POA: Diagnosis not present

## 2016-07-12 DIAGNOSIS — M9903 Segmental and somatic dysfunction of lumbar region: Secondary | ICD-10-CM | POA: Diagnosis not present

## 2016-07-12 DIAGNOSIS — M545 Low back pain: Secondary | ICD-10-CM | POA: Diagnosis not present

## 2016-07-12 DIAGNOSIS — M546 Pain in thoracic spine: Secondary | ICD-10-CM | POA: Diagnosis not present

## 2016-07-12 DIAGNOSIS — M25552 Pain in left hip: Secondary | ICD-10-CM | POA: Diagnosis not present

## 2016-07-15 DIAGNOSIS — F4321 Adjustment disorder with depressed mood: Secondary | ICD-10-CM | POA: Diagnosis not present

## 2016-08-13 DIAGNOSIS — F4321 Adjustment disorder with depressed mood: Secondary | ICD-10-CM | POA: Diagnosis not present

## 2016-09-05 DIAGNOSIS — M545 Low back pain: Secondary | ICD-10-CM | POA: Diagnosis not present

## 2016-09-05 DIAGNOSIS — M25552 Pain in left hip: Secondary | ICD-10-CM | POA: Diagnosis not present

## 2016-09-05 DIAGNOSIS — M791 Myalgia: Secondary | ICD-10-CM | POA: Diagnosis not present

## 2016-09-05 DIAGNOSIS — M546 Pain in thoracic spine: Secondary | ICD-10-CM | POA: Diagnosis not present

## 2016-09-05 DIAGNOSIS — M9903 Segmental and somatic dysfunction of lumbar region: Secondary | ICD-10-CM | POA: Diagnosis not present

## 2016-09-06 DIAGNOSIS — M791 Myalgia: Secondary | ICD-10-CM | POA: Diagnosis not present

## 2016-09-06 DIAGNOSIS — M545 Low back pain: Secondary | ICD-10-CM | POA: Diagnosis not present

## 2016-09-06 DIAGNOSIS — M546 Pain in thoracic spine: Secondary | ICD-10-CM | POA: Diagnosis not present

## 2016-09-06 DIAGNOSIS — M9903 Segmental and somatic dysfunction of lumbar region: Secondary | ICD-10-CM | POA: Diagnosis not present

## 2016-09-06 DIAGNOSIS — M25552 Pain in left hip: Secondary | ICD-10-CM | POA: Diagnosis not present

## 2016-09-12 DIAGNOSIS — M545 Low back pain: Secondary | ICD-10-CM | POA: Diagnosis not present

## 2016-09-12 DIAGNOSIS — M791 Myalgia: Secondary | ICD-10-CM | POA: Diagnosis not present

## 2016-09-12 DIAGNOSIS — M546 Pain in thoracic spine: Secondary | ICD-10-CM | POA: Diagnosis not present

## 2016-09-12 DIAGNOSIS — M9903 Segmental and somatic dysfunction of lumbar region: Secondary | ICD-10-CM | POA: Diagnosis not present

## 2016-09-12 DIAGNOSIS — M25552 Pain in left hip: Secondary | ICD-10-CM | POA: Diagnosis not present

## 2016-10-01 DIAGNOSIS — F4321 Adjustment disorder with depressed mood: Secondary | ICD-10-CM | POA: Diagnosis not present

## 2016-10-09 ENCOUNTER — Ambulatory Visit (INDEPENDENT_AMBULATORY_CARE_PROVIDER_SITE_OTHER): Payer: BLUE CROSS/BLUE SHIELD | Admitting: Sports Medicine

## 2016-10-09 ENCOUNTER — Encounter: Payer: Self-pay | Admitting: Sports Medicine

## 2016-10-09 VITALS — BP 111/66 | Ht 71.0 in | Wt 195.0 lb

## 2016-10-09 DIAGNOSIS — S83412A Sprain of medial collateral ligament of left knee, initial encounter: Secondary | ICD-10-CM

## 2016-10-09 NOTE — Progress Notes (Signed)
   Subjective:    Patient ID: Brett Payne, male    DOB: 12/21/1956, 60 y.o.   MRN: [id number]  HPI   Brett Payne is 60 y.o. male s/p right knee partial meniscectomy for torn medial meniscus in April 2017 who presents with acute right medial knee pain. Was hit on the lateral aspect of his quad during a hockey game 3 weeks ago causing his knee to buckle medially. Knee felt sore during rest of game but pain was worsened acutely the next morning. Denies popping or shifting sensation with injury.   Pain gradually subsided but has been recurring this past week. Reports that pain occurs with activity particularly with running. Pain does not radiate down his leg. He denies weakness of the right lower extremity. Denies significant swelling after injury. Did notice some mild swelling during spin classes since his surgery prior to this injury and some edema above the right patella recently.   He has been wearing a double upright knee brace during hockey since this injury and that helps to keep him pain free during play. Has taken a few doses of Ibuprofen but nothing consistent.    Review of Systems: Per HPI      Objective:   Physical Exam Blood pressure 111/66, height 5\' 11"  (1.803 m), weight 195 lb (88.5 kg). Gen: Pleasant male in NAD  Right Knee: Mild edema appreciated superior to the patella but no effusion.No erythema. TTP over the origin of the MCL and at the medial joint line. Full ROM. 1+ laxity with valgus stress testing bilaterally but reproduces pain on right. No joint laxity with varus stress. Negative anterior drawer, posterior drawer, Lachman's, and Thessaly. Walks without a limp.  Ultrasound: Brief bedside ultrasound of right knee performed. No significant joint effusion. Medial meniscus appears to be intact. MCL intact with diffuse edema surrounding the entire length of the ligament.     Assessment & Plan:    Right MCLSprain: Exam and ultrasound findings consistent with  MCL sprain. Lack of joint effusion and negative Thessaly reassuring that medial meniscus likely not injured. Suspect that sprain will improve with time.  -activity as tolerated with continued use of knee brace  -Aleve BID x3-4 days to reduce inflammation  -Call in three weeks if no improvement ([place] in [place] at which point I would consider further diagnostic imaging.  Marcy Sirenatherine Levone Otten, D.O. 10/09/2016, 10:49 AM PGY-2, [facility]   Patient seen and evaluated with the above named resident. I agree with the plan of care.

## 2016-11-14 DIAGNOSIS — M546 Pain in thoracic spine: Secondary | ICD-10-CM | POA: Diagnosis not present

## 2016-11-14 DIAGNOSIS — M9903 Segmental and somatic dysfunction of lumbar region: Secondary | ICD-10-CM | POA: Diagnosis not present

## 2016-11-14 DIAGNOSIS — M25552 Pain in left hip: Secondary | ICD-10-CM | POA: Diagnosis not present

## 2016-11-14 DIAGNOSIS — M791 Myalgia: Secondary | ICD-10-CM | POA: Diagnosis not present

## 2016-11-14 DIAGNOSIS — M545 Low back pain: Secondary | ICD-10-CM | POA: Diagnosis not present

## 2016-11-16 DIAGNOSIS — M545 Low back pain: Secondary | ICD-10-CM | POA: Diagnosis not present

## 2016-11-16 DIAGNOSIS — M791 Myalgia: Secondary | ICD-10-CM | POA: Diagnosis not present

## 2016-11-16 DIAGNOSIS — M25552 Pain in left hip: Secondary | ICD-10-CM | POA: Diagnosis not present

## 2016-11-16 DIAGNOSIS — M546 Pain in thoracic spine: Secondary | ICD-10-CM | POA: Diagnosis not present

## 2016-11-16 DIAGNOSIS — M9903 Segmental and somatic dysfunction of lumbar region: Secondary | ICD-10-CM | POA: Diagnosis not present

## 2016-11-19 DIAGNOSIS — M545 Low back pain: Secondary | ICD-10-CM | POA: Diagnosis not present

## 2016-11-19 DIAGNOSIS — M25552 Pain in left hip: Secondary | ICD-10-CM | POA: Diagnosis not present

## 2016-11-19 DIAGNOSIS — M9903 Segmental and somatic dysfunction of lumbar region: Secondary | ICD-10-CM | POA: Diagnosis not present

## 2016-11-19 DIAGNOSIS — M546 Pain in thoracic spine: Secondary | ICD-10-CM | POA: Diagnosis not present

## 2016-11-19 DIAGNOSIS — M791 Myalgia: Secondary | ICD-10-CM | POA: Diagnosis not present

## 2016-12-10 DIAGNOSIS — M791 Myalgia: Secondary | ICD-10-CM | POA: Diagnosis not present

## 2016-12-10 DIAGNOSIS — M25552 Pain in left hip: Secondary | ICD-10-CM | POA: Diagnosis not present

## 2016-12-10 DIAGNOSIS — M546 Pain in thoracic spine: Secondary | ICD-10-CM | POA: Diagnosis not present

## 2016-12-10 DIAGNOSIS — M9903 Segmental and somatic dysfunction of lumbar region: Secondary | ICD-10-CM | POA: Diagnosis not present

## 2016-12-10 DIAGNOSIS — M545 Low back pain: Secondary | ICD-10-CM | POA: Diagnosis not present

## 2016-12-12 DIAGNOSIS — M25552 Pain in left hip: Secondary | ICD-10-CM | POA: Diagnosis not present

## 2016-12-12 DIAGNOSIS — M9903 Segmental and somatic dysfunction of lumbar region: Secondary | ICD-10-CM | POA: Diagnosis not present

## 2016-12-12 DIAGNOSIS — M791 Myalgia: Secondary | ICD-10-CM | POA: Diagnosis not present

## 2016-12-12 DIAGNOSIS — M546 Pain in thoracic spine: Secondary | ICD-10-CM | POA: Diagnosis not present

## 2016-12-12 DIAGNOSIS — M545 Low back pain: Secondary | ICD-10-CM | POA: Diagnosis not present

## 2016-12-14 DIAGNOSIS — M791 Myalgia: Secondary | ICD-10-CM | POA: Diagnosis not present

## 2016-12-14 DIAGNOSIS — M9903 Segmental and somatic dysfunction of lumbar region: Secondary | ICD-10-CM | POA: Diagnosis not present

## 2016-12-14 DIAGNOSIS — M545 Low back pain: Secondary | ICD-10-CM | POA: Diagnosis not present

## 2016-12-14 DIAGNOSIS — M546 Pain in thoracic spine: Secondary | ICD-10-CM | POA: Diagnosis not present

## 2016-12-14 DIAGNOSIS — M25552 Pain in left hip: Secondary | ICD-10-CM | POA: Diagnosis not present

## 2016-12-27 DIAGNOSIS — M9903 Segmental and somatic dysfunction of lumbar region: Secondary | ICD-10-CM | POA: Diagnosis not present

## 2016-12-27 DIAGNOSIS — M791 Myalgia: Secondary | ICD-10-CM | POA: Diagnosis not present

## 2016-12-27 DIAGNOSIS — M25552 Pain in left hip: Secondary | ICD-10-CM | POA: Diagnosis not present

## 2016-12-27 DIAGNOSIS — M546 Pain in thoracic spine: Secondary | ICD-10-CM | POA: Diagnosis not present

## 2016-12-27 DIAGNOSIS — M545 Low back pain: Secondary | ICD-10-CM | POA: Diagnosis not present

## 2017-01-22 DIAGNOSIS — Z125 Encounter for screening for malignant neoplasm of prostate: Secondary | ICD-10-CM | POA: Diagnosis not present

## 2017-01-22 DIAGNOSIS — M179 Osteoarthritis of knee, unspecified: Secondary | ICD-10-CM | POA: Diagnosis not present

## 2017-01-22 DIAGNOSIS — F419 Anxiety disorder, unspecified: Secondary | ICD-10-CM | POA: Diagnosis not present

## 2017-01-22 DIAGNOSIS — E782 Mixed hyperlipidemia: Secondary | ICD-10-CM | POA: Diagnosis not present

## 2017-01-22 DIAGNOSIS — L648 Other androgenic alopecia: Secondary | ICD-10-CM | POA: Diagnosis not present

## 2017-01-22 DIAGNOSIS — Z79899 Other long term (current) drug therapy: Secondary | ICD-10-CM | POA: Diagnosis not present

## 2017-01-22 DIAGNOSIS — E559 Vitamin D deficiency, unspecified: Secondary | ICD-10-CM | POA: Diagnosis not present

## 2017-01-22 DIAGNOSIS — Z0001 Encounter for general adult medical examination with abnormal findings: Secondary | ICD-10-CM | POA: Diagnosis not present

## 2017-02-25 DIAGNOSIS — M9903 Segmental and somatic dysfunction of lumbar region: Secondary | ICD-10-CM | POA: Diagnosis not present

## 2017-02-25 DIAGNOSIS — M545 Low back pain: Secondary | ICD-10-CM | POA: Diagnosis not present

## 2017-02-25 DIAGNOSIS — M791 Myalgia: Secondary | ICD-10-CM | POA: Diagnosis not present

## 2017-02-25 DIAGNOSIS — M546 Pain in thoracic spine: Secondary | ICD-10-CM | POA: Diagnosis not present

## 2017-02-25 DIAGNOSIS — M25552 Pain in left hip: Secondary | ICD-10-CM | POA: Diagnosis not present

## 2017-02-27 DIAGNOSIS — M25552 Pain in left hip: Secondary | ICD-10-CM | POA: Diagnosis not present

## 2017-02-27 DIAGNOSIS — M546 Pain in thoracic spine: Secondary | ICD-10-CM | POA: Diagnosis not present

## 2017-02-27 DIAGNOSIS — M545 Low back pain: Secondary | ICD-10-CM | POA: Diagnosis not present

## 2017-02-27 DIAGNOSIS — M9903 Segmental and somatic dysfunction of lumbar region: Secondary | ICD-10-CM | POA: Diagnosis not present

## 2017-02-27 DIAGNOSIS — M791 Myalgia: Secondary | ICD-10-CM | POA: Diagnosis not present

## 2017-03-05 DIAGNOSIS — H25813 Combined forms of age-related cataract, bilateral: Secondary | ICD-10-CM | POA: Diagnosis not present

## 2017-03-15 DIAGNOSIS — M546 Pain in thoracic spine: Secondary | ICD-10-CM | POA: Diagnosis not present

## 2017-03-15 DIAGNOSIS — M9903 Segmental and somatic dysfunction of lumbar region: Secondary | ICD-10-CM | POA: Diagnosis not present

## 2017-03-15 DIAGNOSIS — M545 Low back pain: Secondary | ICD-10-CM | POA: Diagnosis not present

## 2017-03-15 DIAGNOSIS — M791 Myalgia: Secondary | ICD-10-CM | POA: Diagnosis not present

## 2017-03-20 DIAGNOSIS — M545 Low back pain: Secondary | ICD-10-CM | POA: Diagnosis not present

## 2017-03-20 DIAGNOSIS — M546 Pain in thoracic spine: Secondary | ICD-10-CM | POA: Diagnosis not present

## 2017-03-20 DIAGNOSIS — M9903 Segmental and somatic dysfunction of lumbar region: Secondary | ICD-10-CM | POA: Diagnosis not present

## 2017-03-20 DIAGNOSIS — M791 Myalgia: Secondary | ICD-10-CM | POA: Diagnosis not present

## 2017-03-31 DIAGNOSIS — F4321 Adjustment disorder with depressed mood: Secondary | ICD-10-CM | POA: Diagnosis not present

## 2017-04-03 DIAGNOSIS — M546 Pain in thoracic spine: Secondary | ICD-10-CM | POA: Diagnosis not present

## 2017-04-03 DIAGNOSIS — M9903 Segmental and somatic dysfunction of lumbar region: Secondary | ICD-10-CM | POA: Diagnosis not present

## 2017-04-03 DIAGNOSIS — M545 Low back pain: Secondary | ICD-10-CM | POA: Diagnosis not present

## 2017-04-03 DIAGNOSIS — M791 Myalgia: Secondary | ICD-10-CM | POA: Diagnosis not present

## 2017-04-05 DIAGNOSIS — M546 Pain in thoracic spine: Secondary | ICD-10-CM | POA: Diagnosis not present

## 2017-04-05 DIAGNOSIS — M791 Myalgia: Secondary | ICD-10-CM | POA: Diagnosis not present

## 2017-04-05 DIAGNOSIS — M9903 Segmental and somatic dysfunction of lumbar region: Secondary | ICD-10-CM | POA: Diagnosis not present

## 2017-04-05 DIAGNOSIS — M545 Low back pain: Secondary | ICD-10-CM | POA: Diagnosis not present

## 2017-04-10 DIAGNOSIS — M791 Myalgia: Secondary | ICD-10-CM | POA: Diagnosis not present

## 2017-04-10 DIAGNOSIS — M546 Pain in thoracic spine: Secondary | ICD-10-CM | POA: Diagnosis not present

## 2017-04-10 DIAGNOSIS — M9903 Segmental and somatic dysfunction of lumbar region: Secondary | ICD-10-CM | POA: Diagnosis not present

## 2017-04-10 DIAGNOSIS — M545 Low back pain: Secondary | ICD-10-CM | POA: Diagnosis not present

## 2017-04-24 DIAGNOSIS — T7840XA Allergy, unspecified, initial encounter: Secondary | ICD-10-CM | POA: Diagnosis not present

## 2017-04-24 DIAGNOSIS — K219 Gastro-esophageal reflux disease without esophagitis: Secondary | ICD-10-CM | POA: Diagnosis not present

## 2017-04-24 DIAGNOSIS — R5383 Other fatigue: Secondary | ICD-10-CM | POA: Diagnosis not present

## 2017-04-25 DIAGNOSIS — L308 Other specified dermatitis: Secondary | ICD-10-CM | POA: Diagnosis not present

## 2017-05-05 DIAGNOSIS — L308 Other specified dermatitis: Secondary | ICD-10-CM | POA: Diagnosis not present

## 2017-05-07 DIAGNOSIS — L308 Other specified dermatitis: Secondary | ICD-10-CM | POA: Diagnosis not present

## 2017-05-09 DIAGNOSIS — L308 Other specified dermatitis: Secondary | ICD-10-CM | POA: Diagnosis not present

## 2017-05-09 DIAGNOSIS — Z79899 Other long term (current) drug therapy: Secondary | ICD-10-CM | POA: Diagnosis not present

## 2017-05-12 DIAGNOSIS — Z79899 Other long term (current) drug therapy: Secondary | ICD-10-CM | POA: Diagnosis not present

## 2017-05-12 DIAGNOSIS — L308 Other specified dermatitis: Secondary | ICD-10-CM | POA: Diagnosis not present

## 2017-05-14 DIAGNOSIS — L308 Other specified dermatitis: Secondary | ICD-10-CM | POA: Diagnosis not present

## 2017-05-16 DIAGNOSIS — L308 Other specified dermatitis: Secondary | ICD-10-CM | POA: Diagnosis not present

## 2017-05-28 DIAGNOSIS — M9903 Segmental and somatic dysfunction of lumbar region: Secondary | ICD-10-CM | POA: Diagnosis not present

## 2017-05-28 DIAGNOSIS — M546 Pain in thoracic spine: Secondary | ICD-10-CM | POA: Diagnosis not present

## 2017-05-28 DIAGNOSIS — M791 Myalgia: Secondary | ICD-10-CM | POA: Diagnosis not present

## 2017-05-28 DIAGNOSIS — M545 Low back pain: Secondary | ICD-10-CM | POA: Diagnosis not present

## 2017-05-30 DIAGNOSIS — M546 Pain in thoracic spine: Secondary | ICD-10-CM | POA: Diagnosis not present

## 2017-05-30 DIAGNOSIS — M545 Low back pain: Secondary | ICD-10-CM | POA: Diagnosis not present

## 2017-05-30 DIAGNOSIS — M9903 Segmental and somatic dysfunction of lumbar region: Secondary | ICD-10-CM | POA: Diagnosis not present

## 2017-05-30 DIAGNOSIS — M791 Myalgia: Secondary | ICD-10-CM | POA: Diagnosis not present

## 2017-06-03 DIAGNOSIS — M791 Myalgia: Secondary | ICD-10-CM | POA: Diagnosis not present

## 2017-06-03 DIAGNOSIS — M545 Low back pain: Secondary | ICD-10-CM | POA: Diagnosis not present

## 2017-06-03 DIAGNOSIS — M9903 Segmental and somatic dysfunction of lumbar region: Secondary | ICD-10-CM | POA: Diagnosis not present

## 2017-06-03 DIAGNOSIS — M546 Pain in thoracic spine: Secondary | ICD-10-CM | POA: Diagnosis not present

## 2017-06-11 DIAGNOSIS — Z79899 Other long term (current) drug therapy: Secondary | ICD-10-CM | POA: Diagnosis not present

## 2017-06-11 DIAGNOSIS — L308 Other specified dermatitis: Secondary | ICD-10-CM | POA: Diagnosis not present

## 2017-06-12 DIAGNOSIS — L308 Other specified dermatitis: Secondary | ICD-10-CM | POA: Diagnosis not present

## 2017-06-12 DIAGNOSIS — R0982 Postnasal drip: Secondary | ICD-10-CM | POA: Diagnosis not present

## 2017-07-10 IMAGING — CR DG KNEE COMPLETE 4+V*R*
3 series · 3 of 3 positions shown · non-contrast
Comparison: None in PACs

CLINICAL DATA: Onset of medial right knee pain after running
several days ago. No discrete injury.

EXAM:
RIGHT KNEE - COMPLETE 4+ VIEW

[w knee obl. right (1 of 2)]
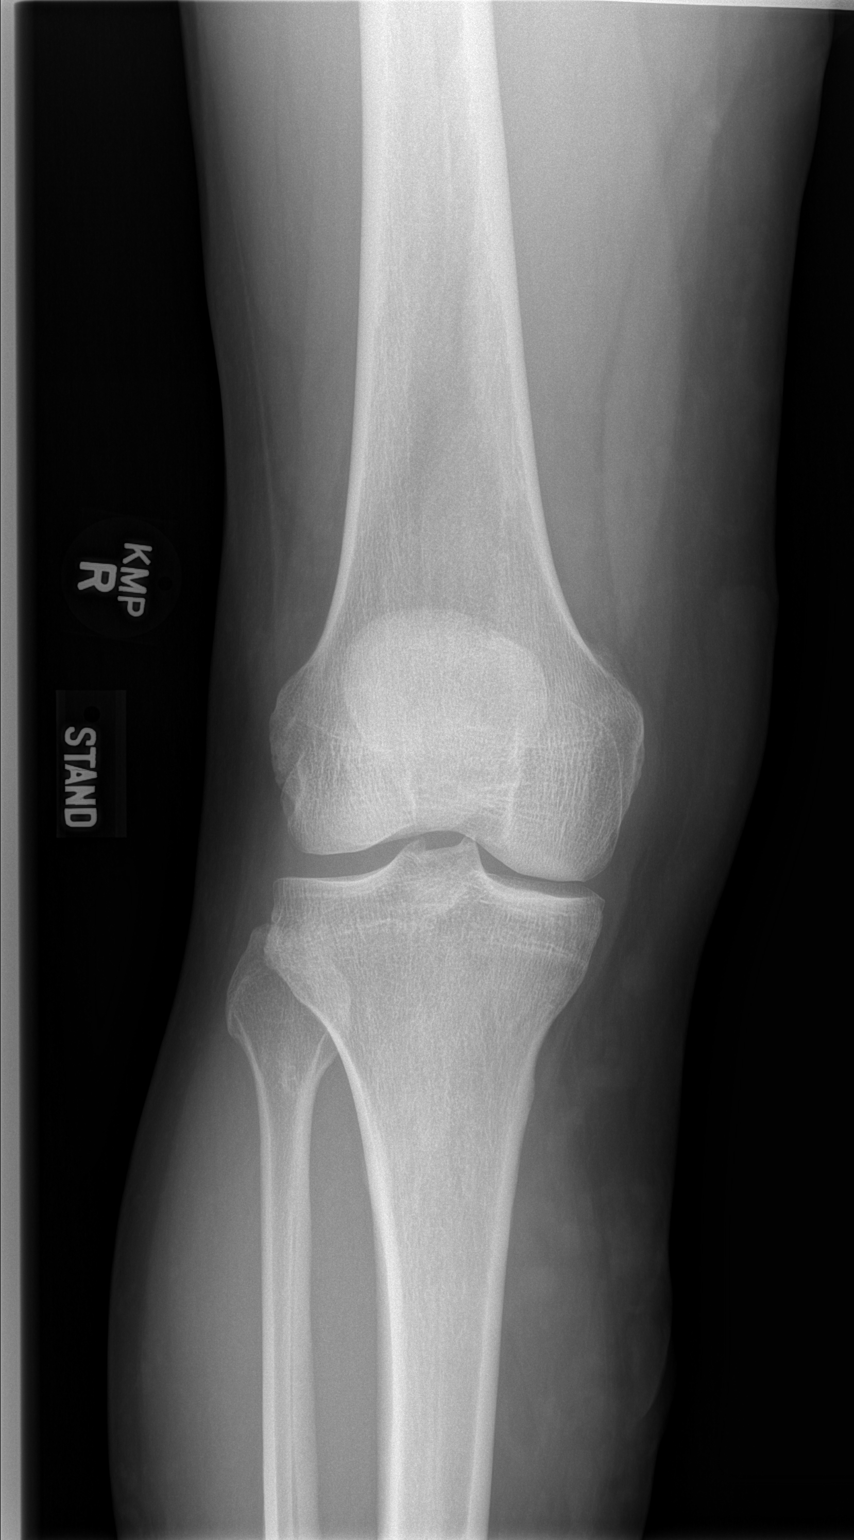

[w knee obl. right (2 of 2)]
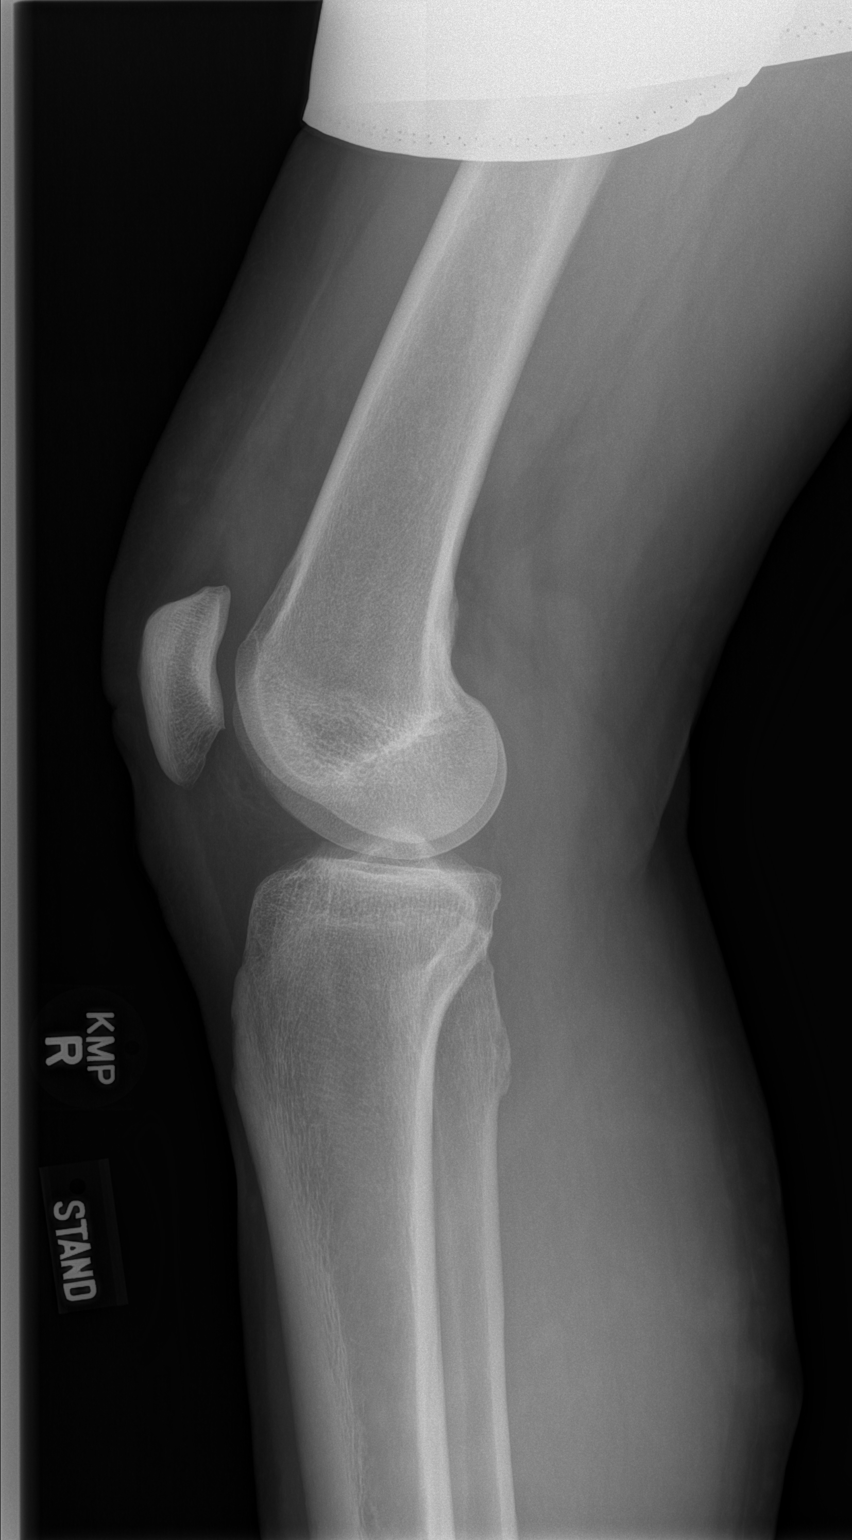

[w knee lat. right]
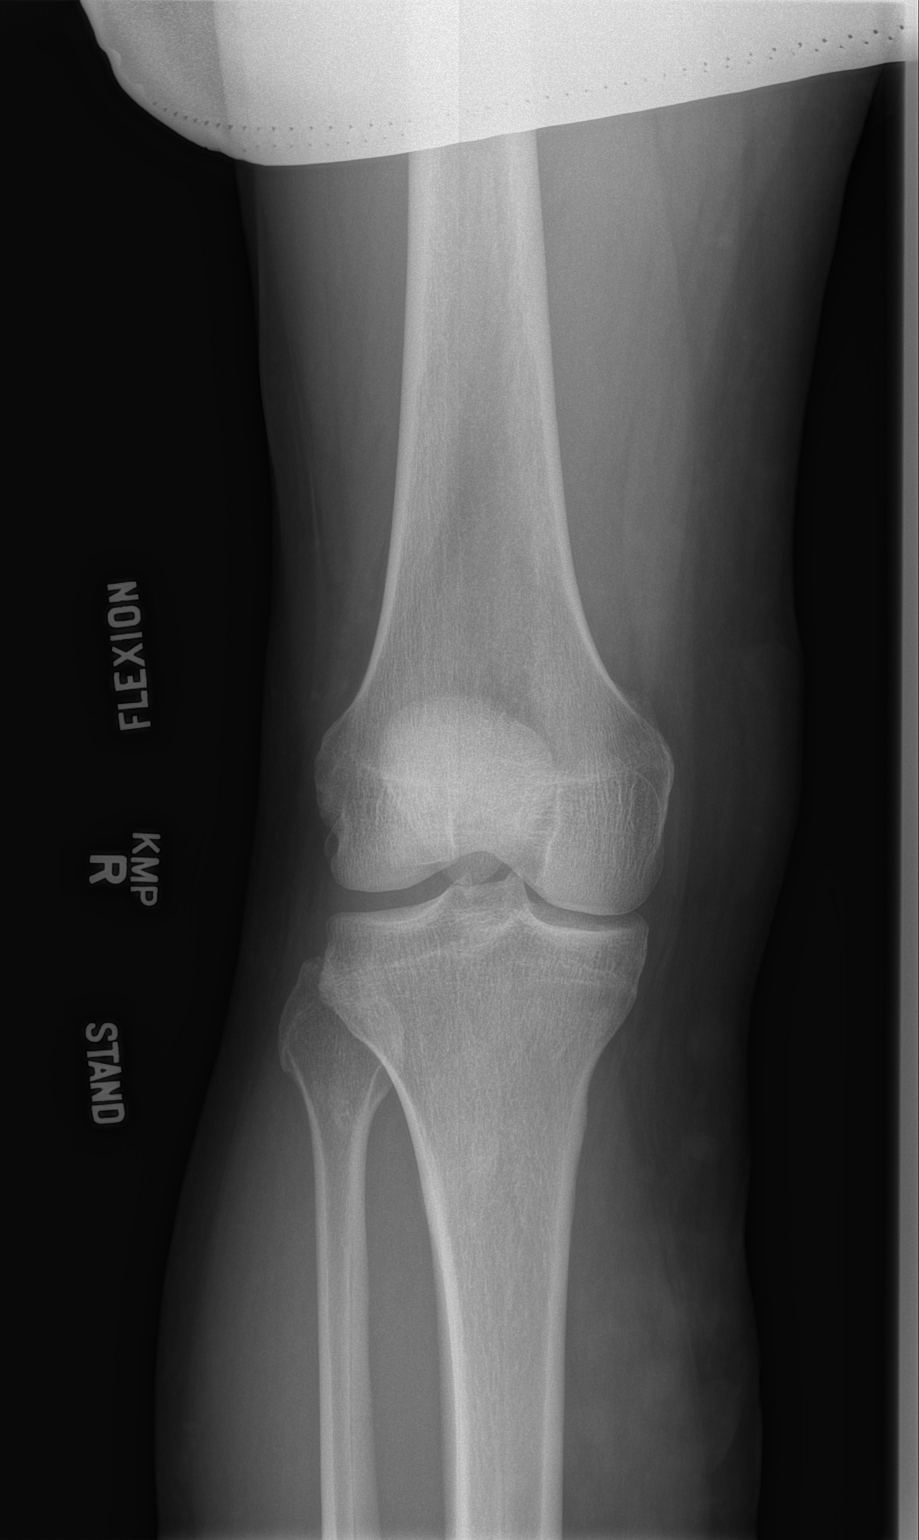

[3 of 3 positions shown; findings below may reference images not displayed]

FINDINGS: The bones are adequately mineralized. There is mild narrowing of the
medial joint compartment. There is beaking of the tibial spines.
There is no acute fracture nor dislocation. There is a small
suprapatellar joint effusion. The patellofemoral joint is
unremarkable.
IMPRESSION: Mild degenerative narrowing of the medial joint compartment with
beaking of the tibial spines. There is no acute bony abnormality.

## 2017-07-11 DIAGNOSIS — L308 Other specified dermatitis: Secondary | ICD-10-CM | POA: Diagnosis not present

## 2017-07-17 IMAGING — MR MR KNEE*R* W/O CM
4 of 5 series · 20 of 40 positions shown · non-contrast
Comparison: None.

CLINICAL DATA: Right knee pain.  Felt a pop 8 weeks ago.

EXAM:
MRI OF THE RIGHT KNEE WITHOUT CONTRAST
TECHNIQUE: Multiplanar, multisequence MR imaging of the knee was performed. No
intravenous contrast was administered.

[Series 3: pd_tse_fs_tra · axial · 3.5mm · 0.42mm/px · z∈[-58,+21]mm · 3 of 27 slices shown]
[im 4/27]
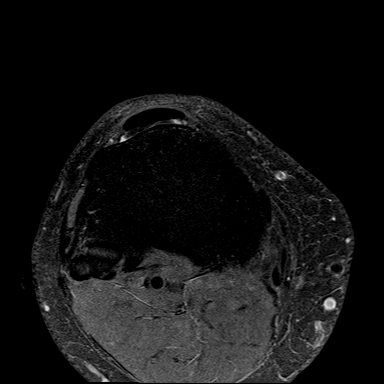
[im 15/27]
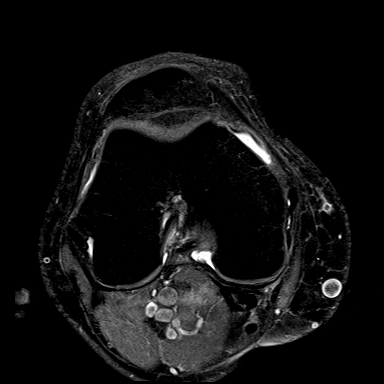
[im 23/27]
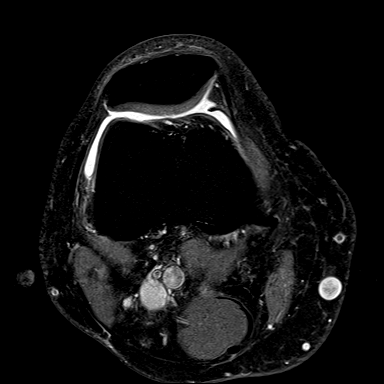

[Series 5: T2 fat-sat · coronal · 3.2mm · 0.62mm/px · 6 of 28 slices shown]
[im 1/28]
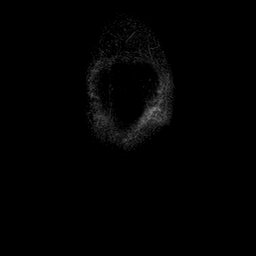
[im 4/28]
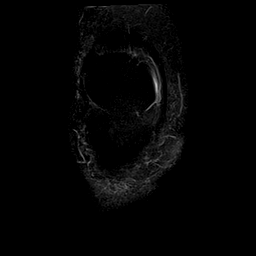
[im 8/28]
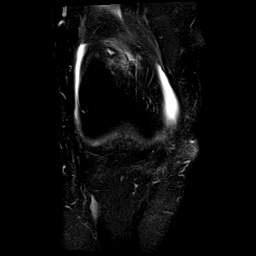
[im 12/28]
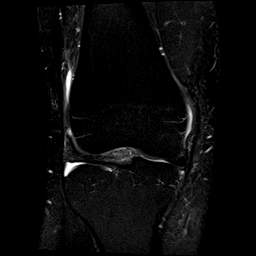
[im 16/28]
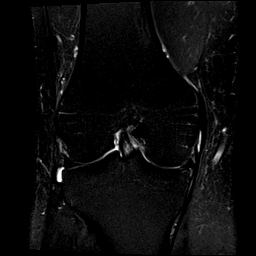
[im 24/28]
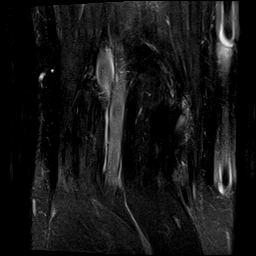

[Series 6: T1 · coronal · 3.2mm · 0.25mm/px · 3 of 28 slices shown]
[im 4/28]
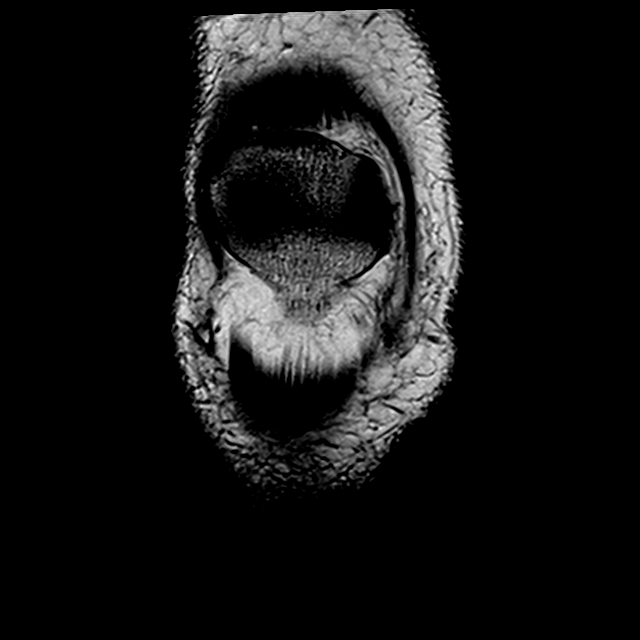
[im 16/28]
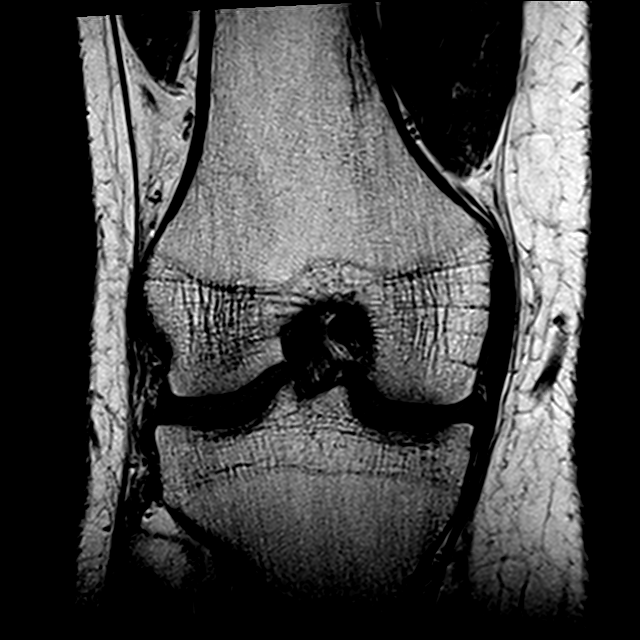
[im 24/28]
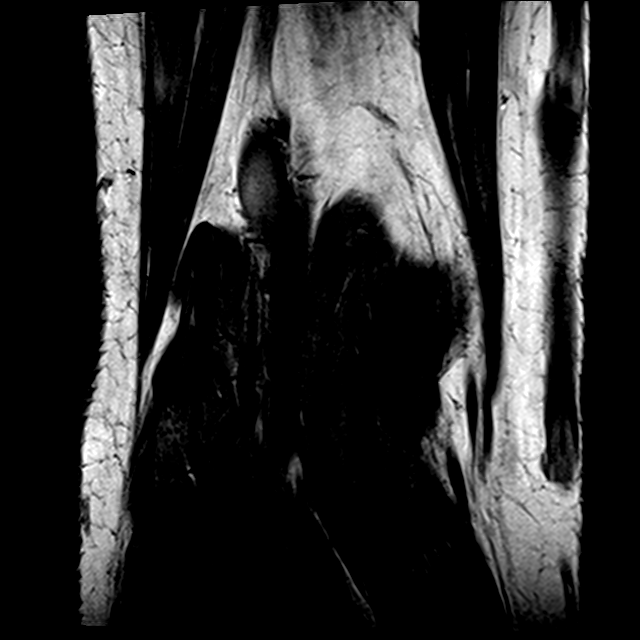

[Series 7: PD fat-sat · sagittal · 3.5mm · 0.25mm/px · 8 of 27 slices shown]
[im 1/27]
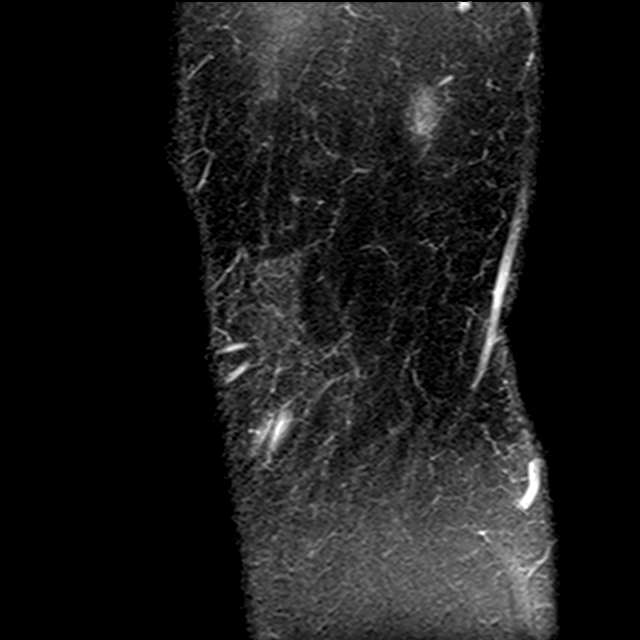
[im 4/27]
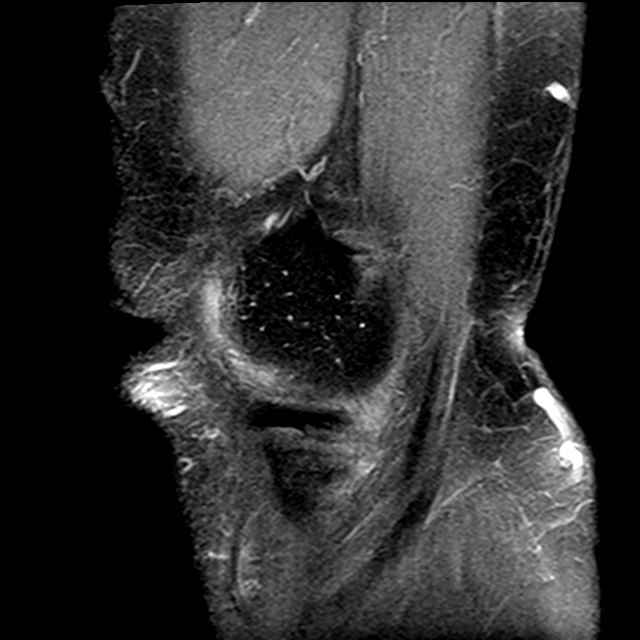
[im 8/27]
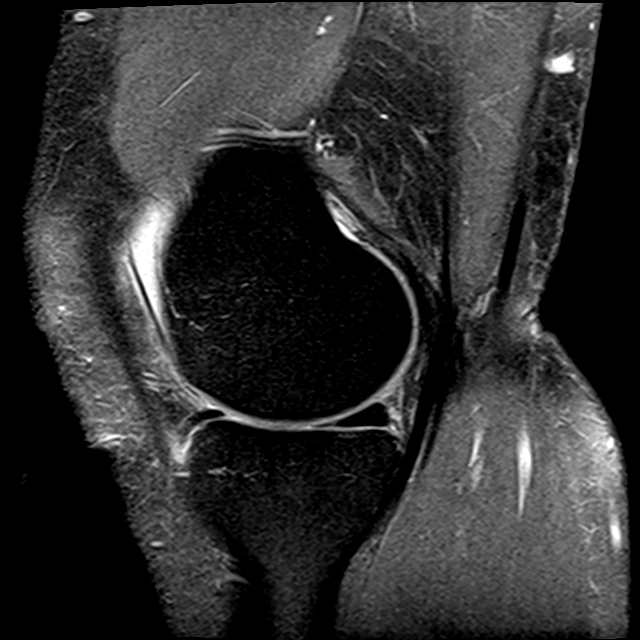
[im 12/27]
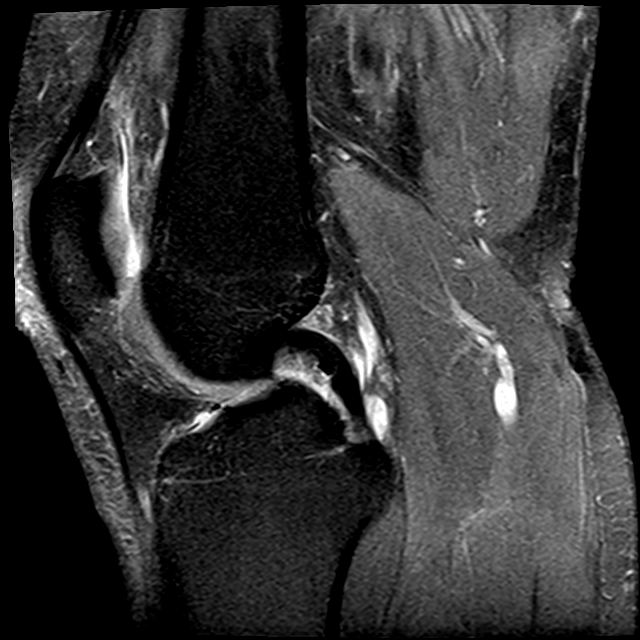
[im 15/27]
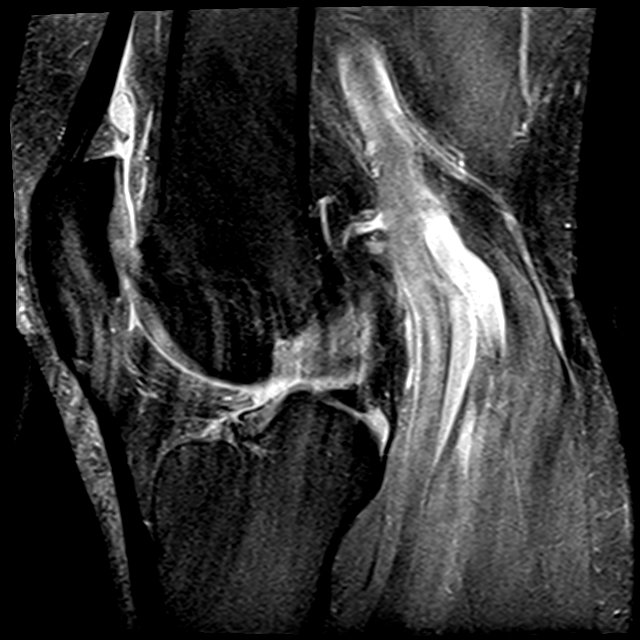
[im 19/27]
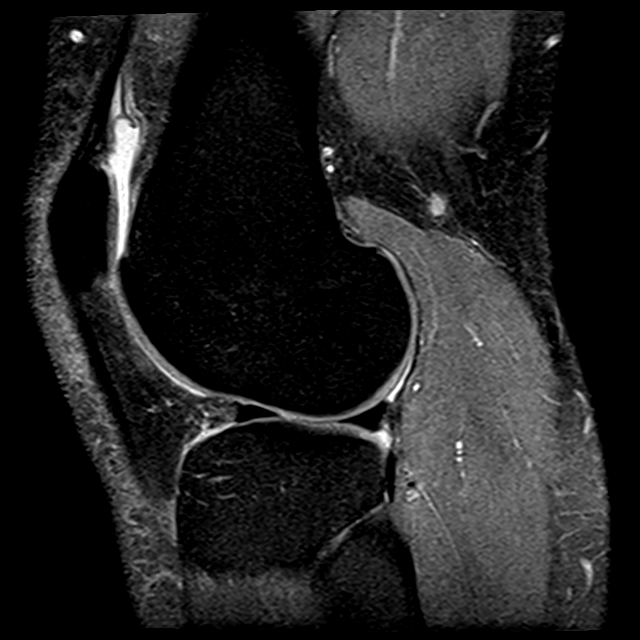
[im 23/27]
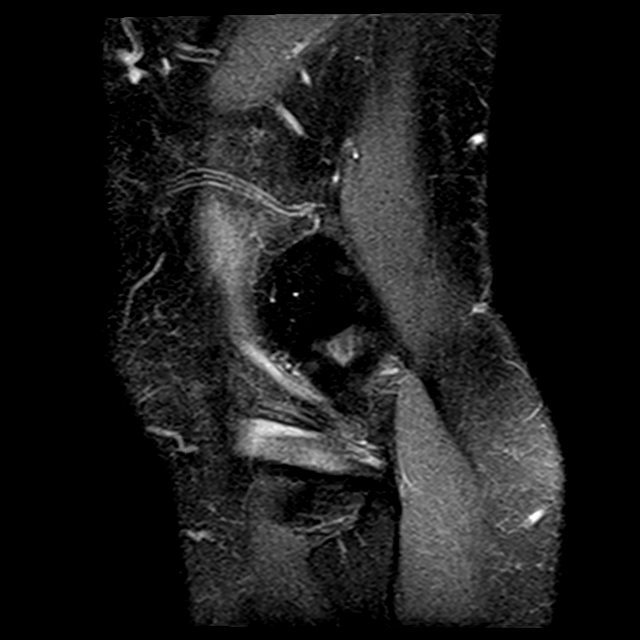
[im 27/27]
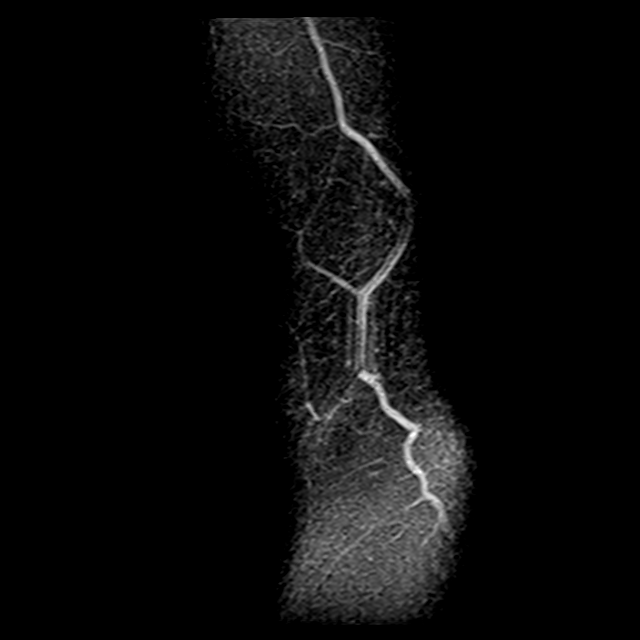

[20 of 40 positions shown; findings below may reference images not displayed]

FINDINGS: MENISCI

Medial meniscus: Oblique tear of the posterior horn of the medial
meniscus extending to the superior articular surface.

Lateral meniscus:  Intact.

LIGAMENTS

Cruciates:  Intact ACL and PCL.

Collaterals: Medial collateral ligament is intact. Lateral
collateral ligament complex is intact.

CARTILAGE

Patellofemoral:  Cartilage fissuring of the lateral patellar facet.

Medial: Mild partial-thickness cartilage loss of the medial femoral
condyle and medial tibial plateau.

Lateral:  No chondral defect.

Joint:  Small joint effusion.  Mild edema in Hoffa's fat.

Popliteal Fossa:  No Baker cyst.  Intact popliteus tendon.

Extensor Mechanism:  Intact.

Bones: No focal marrow signal abnormality. No acute fracture or
dislocation.
IMPRESSION: 1. Oblique tear of the posterior horn of the medial meniscus
extending to the superior articular surface.
2. Mild partial-thickness cartilage loss of the medial femoral
condyle and medial tibial plateau.
3. Cartilage fissuring of the lateral patellar facet.

## 2017-07-29 DIAGNOSIS — M546 Pain in thoracic spine: Secondary | ICD-10-CM | POA: Diagnosis not present

## 2017-07-29 DIAGNOSIS — M545 Low back pain: Secondary | ICD-10-CM | POA: Diagnosis not present

## 2017-07-29 DIAGNOSIS — M791 Myalgia: Secondary | ICD-10-CM | POA: Diagnosis not present

## 2017-07-29 DIAGNOSIS — M9903 Segmental and somatic dysfunction of lumbar region: Secondary | ICD-10-CM | POA: Diagnosis not present

## 2017-07-30 DIAGNOSIS — M546 Pain in thoracic spine: Secondary | ICD-10-CM | POA: Diagnosis not present

## 2017-07-30 DIAGNOSIS — M545 Low back pain: Secondary | ICD-10-CM | POA: Diagnosis not present

## 2017-07-30 DIAGNOSIS — M9903 Segmental and somatic dysfunction of lumbar region: Secondary | ICD-10-CM | POA: Diagnosis not present

## 2017-07-30 DIAGNOSIS — M791 Myalgia: Secondary | ICD-10-CM | POA: Diagnosis not present

## 2017-08-20 DIAGNOSIS — M545 Low back pain: Secondary | ICD-10-CM | POA: Diagnosis not present

## 2017-08-20 DIAGNOSIS — M546 Pain in thoracic spine: Secondary | ICD-10-CM | POA: Diagnosis not present

## 2017-08-20 DIAGNOSIS — M9903 Segmental and somatic dysfunction of lumbar region: Secondary | ICD-10-CM | POA: Diagnosis not present

## 2017-08-20 DIAGNOSIS — M791 Myalgia: Secondary | ICD-10-CM | POA: Diagnosis not present

## 2017-08-22 DIAGNOSIS — M791 Myalgia: Secondary | ICD-10-CM | POA: Diagnosis not present

## 2017-08-22 DIAGNOSIS — M9903 Segmental and somatic dysfunction of lumbar region: Secondary | ICD-10-CM | POA: Diagnosis not present

## 2017-08-22 DIAGNOSIS — M546 Pain in thoracic spine: Secondary | ICD-10-CM | POA: Diagnosis not present

## 2017-08-22 DIAGNOSIS — M545 Low back pain: Secondary | ICD-10-CM | POA: Diagnosis not present

## 2017-09-25 DIAGNOSIS — F4321 Adjustment disorder with depressed mood: Secondary | ICD-10-CM | POA: Diagnosis not present

## 2017-10-01 DIAGNOSIS — M546 Pain in thoracic spine: Secondary | ICD-10-CM | POA: Diagnosis not present

## 2017-10-01 DIAGNOSIS — M545 Low back pain: Secondary | ICD-10-CM | POA: Diagnosis not present

## 2017-10-01 DIAGNOSIS — M9903 Segmental and somatic dysfunction of lumbar region: Secondary | ICD-10-CM | POA: Diagnosis not present

## 2017-10-01 DIAGNOSIS — M7918 Myalgia, other site: Secondary | ICD-10-CM | POA: Diagnosis not present

## 2017-10-03 DIAGNOSIS — M7918 Myalgia, other site: Secondary | ICD-10-CM | POA: Diagnosis not present

## 2017-10-03 DIAGNOSIS — M545 Low back pain: Secondary | ICD-10-CM | POA: Diagnosis not present

## 2017-10-03 DIAGNOSIS — M546 Pain in thoracic spine: Secondary | ICD-10-CM | POA: Diagnosis not present

## 2017-10-03 DIAGNOSIS — M9903 Segmental and somatic dysfunction of lumbar region: Secondary | ICD-10-CM | POA: Diagnosis not present

## 2017-10-07 DIAGNOSIS — M9903 Segmental and somatic dysfunction of lumbar region: Secondary | ICD-10-CM | POA: Diagnosis not present

## 2017-10-07 DIAGNOSIS — M546 Pain in thoracic spine: Secondary | ICD-10-CM | POA: Diagnosis not present

## 2017-10-07 DIAGNOSIS — M7918 Myalgia, other site: Secondary | ICD-10-CM | POA: Diagnosis not present

## 2017-10-07 DIAGNOSIS — M545 Low back pain: Secondary | ICD-10-CM | POA: Diagnosis not present

## 2017-10-15 DIAGNOSIS — M7918 Myalgia, other site: Secondary | ICD-10-CM | POA: Diagnosis not present

## 2017-10-15 DIAGNOSIS — M9903 Segmental and somatic dysfunction of lumbar region: Secondary | ICD-10-CM | POA: Diagnosis not present

## 2017-10-15 DIAGNOSIS — M545 Low back pain: Secondary | ICD-10-CM | POA: Diagnosis not present

## 2017-10-15 DIAGNOSIS — M546 Pain in thoracic spine: Secondary | ICD-10-CM | POA: Diagnosis not present

## 2017-10-18 DIAGNOSIS — M7918 Myalgia, other site: Secondary | ICD-10-CM | POA: Diagnosis not present

## 2017-10-18 DIAGNOSIS — M545 Low back pain: Secondary | ICD-10-CM | POA: Diagnosis not present

## 2017-10-18 DIAGNOSIS — M9903 Segmental and somatic dysfunction of lumbar region: Secondary | ICD-10-CM | POA: Diagnosis not present

## 2017-10-18 DIAGNOSIS — M546 Pain in thoracic spine: Secondary | ICD-10-CM | POA: Diagnosis not present

## 2017-10-20 DIAGNOSIS — F4321 Adjustment disorder with depressed mood: Secondary | ICD-10-CM | POA: Diagnosis not present

## 2017-10-23 DIAGNOSIS — M9903 Segmental and somatic dysfunction of lumbar region: Secondary | ICD-10-CM | POA: Diagnosis not present

## 2017-10-23 DIAGNOSIS — M546 Pain in thoracic spine: Secondary | ICD-10-CM | POA: Diagnosis not present

## 2017-10-23 DIAGNOSIS — M545 Low back pain: Secondary | ICD-10-CM | POA: Diagnosis not present

## 2017-10-23 DIAGNOSIS — M7918 Myalgia, other site: Secondary | ICD-10-CM | POA: Diagnosis not present

## 2017-10-24 DIAGNOSIS — M546 Pain in thoracic spine: Secondary | ICD-10-CM | POA: Diagnosis not present

## 2017-10-24 DIAGNOSIS — M7918 Myalgia, other site: Secondary | ICD-10-CM | POA: Diagnosis not present

## 2017-10-24 DIAGNOSIS — M9903 Segmental and somatic dysfunction of lumbar region: Secondary | ICD-10-CM | POA: Diagnosis not present

## 2017-10-24 DIAGNOSIS — M545 Low back pain: Secondary | ICD-10-CM | POA: Diagnosis not present

## 2017-11-22 DIAGNOSIS — M546 Pain in thoracic spine: Secondary | ICD-10-CM | POA: Diagnosis not present

## 2017-11-22 DIAGNOSIS — M545 Low back pain: Secondary | ICD-10-CM | POA: Diagnosis not present

## 2017-11-22 DIAGNOSIS — M9903 Segmental and somatic dysfunction of lumbar region: Secondary | ICD-10-CM | POA: Diagnosis not present

## 2017-11-22 DIAGNOSIS — M7918 Myalgia, other site: Secondary | ICD-10-CM | POA: Diagnosis not present

## 2017-11-25 DIAGNOSIS — M9903 Segmental and somatic dysfunction of lumbar region: Secondary | ICD-10-CM | POA: Diagnosis not present

## 2017-11-25 DIAGNOSIS — M7918 Myalgia, other site: Secondary | ICD-10-CM | POA: Diagnosis not present

## 2017-11-25 DIAGNOSIS — M545 Low back pain: Secondary | ICD-10-CM | POA: Diagnosis not present

## 2017-11-25 DIAGNOSIS — M546 Pain in thoracic spine: Secondary | ICD-10-CM | POA: Diagnosis not present

## 2017-12-17 DIAGNOSIS — M7918 Myalgia, other site: Secondary | ICD-10-CM | POA: Diagnosis not present

## 2017-12-17 DIAGNOSIS — M9903 Segmental and somatic dysfunction of lumbar region: Secondary | ICD-10-CM | POA: Diagnosis not present

## 2017-12-17 DIAGNOSIS — M546 Pain in thoracic spine: Secondary | ICD-10-CM | POA: Diagnosis not present

## 2017-12-17 DIAGNOSIS — M545 Low back pain: Secondary | ICD-10-CM | POA: Diagnosis not present

## 2017-12-19 DIAGNOSIS — M7918 Myalgia, other site: Secondary | ICD-10-CM | POA: Diagnosis not present

## 2017-12-19 DIAGNOSIS — M546 Pain in thoracic spine: Secondary | ICD-10-CM | POA: Diagnosis not present

## 2017-12-19 DIAGNOSIS — M545 Low back pain: Secondary | ICD-10-CM | POA: Diagnosis not present

## 2017-12-19 DIAGNOSIS — M9903 Segmental and somatic dysfunction of lumbar region: Secondary | ICD-10-CM | POA: Diagnosis not present

## 2017-12-20 DIAGNOSIS — M546 Pain in thoracic spine: Secondary | ICD-10-CM | POA: Diagnosis not present

## 2017-12-20 DIAGNOSIS — M545 Low back pain: Secondary | ICD-10-CM | POA: Diagnosis not present

## 2017-12-20 DIAGNOSIS — M7918 Myalgia, other site: Secondary | ICD-10-CM | POA: Diagnosis not present

## 2017-12-20 DIAGNOSIS — M9903 Segmental and somatic dysfunction of lumbar region: Secondary | ICD-10-CM | POA: Diagnosis not present

## 2018-01-13 DIAGNOSIS — M545 Low back pain: Secondary | ICD-10-CM | POA: Diagnosis not present

## 2018-01-13 DIAGNOSIS — M7918 Myalgia, other site: Secondary | ICD-10-CM | POA: Diagnosis not present

## 2018-01-13 DIAGNOSIS — M546 Pain in thoracic spine: Secondary | ICD-10-CM | POA: Diagnosis not present

## 2018-01-15 DIAGNOSIS — M545 Low back pain: Secondary | ICD-10-CM | POA: Diagnosis not present

## 2018-01-15 DIAGNOSIS — M7918 Myalgia, other site: Secondary | ICD-10-CM | POA: Diagnosis not present

## 2018-01-15 DIAGNOSIS — M546 Pain in thoracic spine: Secondary | ICD-10-CM | POA: Diagnosis not present

## 2018-01-15 DIAGNOSIS — M9903 Segmental and somatic dysfunction of lumbar region: Secondary | ICD-10-CM | POA: Diagnosis not present

## 2018-01-17 DIAGNOSIS — M546 Pain in thoracic spine: Secondary | ICD-10-CM | POA: Diagnosis not present

## 2018-01-17 DIAGNOSIS — M545 Low back pain: Secondary | ICD-10-CM | POA: Diagnosis not present

## 2018-01-17 DIAGNOSIS — M7918 Myalgia, other site: Secondary | ICD-10-CM | POA: Diagnosis not present

## 2018-01-17 DIAGNOSIS — M9903 Segmental and somatic dysfunction of lumbar region: Secondary | ICD-10-CM | POA: Diagnosis not present

## 2018-01-20 DIAGNOSIS — M545 Low back pain: Secondary | ICD-10-CM | POA: Diagnosis not present

## 2018-01-20 DIAGNOSIS — M9903 Segmental and somatic dysfunction of lumbar region: Secondary | ICD-10-CM | POA: Diagnosis not present

## 2018-01-20 DIAGNOSIS — M546 Pain in thoracic spine: Secondary | ICD-10-CM | POA: Diagnosis not present

## 2018-01-20 DIAGNOSIS — M7918 Myalgia, other site: Secondary | ICD-10-CM | POA: Diagnosis not present

## 2018-01-23 DIAGNOSIS — Z0000 Encounter for general adult medical examination without abnormal findings: Secondary | ICD-10-CM | POA: Diagnosis not present

## 2018-02-13 DIAGNOSIS — M546 Pain in thoracic spine: Secondary | ICD-10-CM | POA: Diagnosis not present

## 2018-02-13 DIAGNOSIS — M545 Low back pain: Secondary | ICD-10-CM | POA: Diagnosis not present

## 2018-02-13 DIAGNOSIS — M9903 Segmental and somatic dysfunction of lumbar region: Secondary | ICD-10-CM | POA: Diagnosis not present

## 2018-02-13 DIAGNOSIS — M7918 Myalgia, other site: Secondary | ICD-10-CM | POA: Diagnosis not present

## 2018-02-14 DIAGNOSIS — M7918 Myalgia, other site: Secondary | ICD-10-CM | POA: Diagnosis not present

## 2018-02-14 DIAGNOSIS — M9903 Segmental and somatic dysfunction of lumbar region: Secondary | ICD-10-CM | POA: Diagnosis not present

## 2018-02-14 DIAGNOSIS — M545 Low back pain: Secondary | ICD-10-CM | POA: Diagnosis not present

## 2018-02-14 DIAGNOSIS — M546 Pain in thoracic spine: Secondary | ICD-10-CM | POA: Diagnosis not present

## 2018-03-05 DIAGNOSIS — M545 Low back pain: Secondary | ICD-10-CM | POA: Diagnosis not present

## 2018-03-05 DIAGNOSIS — M7918 Myalgia, other site: Secondary | ICD-10-CM | POA: Diagnosis not present

## 2018-03-05 DIAGNOSIS — M9903 Segmental and somatic dysfunction of lumbar region: Secondary | ICD-10-CM | POA: Diagnosis not present

## 2018-03-05 DIAGNOSIS — M546 Pain in thoracic spine: Secondary | ICD-10-CM | POA: Diagnosis not present

## 2018-03-07 DIAGNOSIS — M546 Pain in thoracic spine: Secondary | ICD-10-CM | POA: Diagnosis not present

## 2018-03-07 DIAGNOSIS — M9903 Segmental and somatic dysfunction of lumbar region: Secondary | ICD-10-CM | POA: Diagnosis not present

## 2018-03-07 DIAGNOSIS — M545 Low back pain: Secondary | ICD-10-CM | POA: Diagnosis not present

## 2018-03-07 DIAGNOSIS — M7918 Myalgia, other site: Secondary | ICD-10-CM | POA: Diagnosis not present

## 2018-03-09 DIAGNOSIS — M545 Low back pain: Secondary | ICD-10-CM | POA: Diagnosis not present

## 2018-03-09 DIAGNOSIS — M546 Pain in thoracic spine: Secondary | ICD-10-CM | POA: Diagnosis not present

## 2018-03-09 DIAGNOSIS — M9903 Segmental and somatic dysfunction of lumbar region: Secondary | ICD-10-CM | POA: Diagnosis not present

## 2018-03-09 DIAGNOSIS — M7918 Myalgia, other site: Secondary | ICD-10-CM | POA: Diagnosis not present

## 2018-04-09 ENCOUNTER — Telehealth: Payer: Self-pay | Admitting: *Deleted

## 2018-04-09 NOTE — Telephone Encounter (Signed)
Shelly, can you or one of the other girls up front help me out with this please? He was seen October 2014. Thank you.

## 2018-04-09 NOTE — Telephone Encounter (Signed)
Pt states Dr. Charlsie Merlesegal had performed surgery on his left 1st toe 4-5 years agoe and he was now having a problem and now leave out of town. Pt states he is being seen by another doctor and he is requesting x-ray disc copies.

## 2018-04-13 ENCOUNTER — Telehealth: Payer: Self-pay | Admitting: Podiatry

## 2018-04-13 DIAGNOSIS — M2022 Hallux rigidus, left foot: Secondary | ICD-10-CM | POA: Diagnosis not present

## 2018-04-13 DIAGNOSIS — M25561 Pain in right knee: Secondary | ICD-10-CM | POA: Diagnosis not present

## 2018-04-13 DIAGNOSIS — Z9889 Other specified postprocedural states: Secondary | ICD-10-CM | POA: Diagnosis not present

## 2018-04-13 NOTE — Telephone Encounter (Signed)
Called pt and left a voicemail to call me back and leave me a voicemail if I don't answer on how he would like to receive the medical records release form to fill out and sign. I told him once I had the signed form back I would be happy to mail him the CD of his x-rays since he now lives out of town. Told pt to call me back on my direct number at [phone].

## 2018-04-15 DIAGNOSIS — M2022 Hallux rigidus, left foot: Secondary | ICD-10-CM | POA: Diagnosis not present

## 2018-04-17 DIAGNOSIS — M9903 Segmental and somatic dysfunction of lumbar region: Secondary | ICD-10-CM | POA: Diagnosis not present

## 2018-04-17 DIAGNOSIS — M546 Pain in thoracic spine: Secondary | ICD-10-CM | POA: Diagnosis not present

## 2018-04-17 DIAGNOSIS — M7918 Myalgia, other site: Secondary | ICD-10-CM | POA: Diagnosis not present

## 2018-04-17 DIAGNOSIS — M545 Low back pain: Secondary | ICD-10-CM | POA: Diagnosis not present

## 2018-04-18 DIAGNOSIS — M9903 Segmental and somatic dysfunction of lumbar region: Secondary | ICD-10-CM | POA: Diagnosis not present

## 2018-04-18 DIAGNOSIS — M545 Low back pain: Secondary | ICD-10-CM | POA: Diagnosis not present

## 2018-04-18 DIAGNOSIS — M7918 Myalgia, other site: Secondary | ICD-10-CM | POA: Diagnosis not present

## 2018-04-18 DIAGNOSIS — M546 Pain in thoracic spine: Secondary | ICD-10-CM | POA: Diagnosis not present

## 2018-04-23 ENCOUNTER — Telehealth: Payer: Self-pay | Admitting: Podiatry

## 2018-04-23 NOTE — Telephone Encounter (Signed)
I did a follow up call pt to see how he would like to receive a copy of the medical records release form to fill out and sign so that I could send him his CD of x-rays. Pt stated he no longer needed his x-rays from our office as he went to a local podiatrist and they took x-rays. I confirmed with the pt that he no longer needed them and he said that was correct and thought he had called us to say he no longer needed them. I told the pt to take care and have a great day.

## 2018-04-27 DIAGNOSIS — E7800 Pure hypercholesterolemia, unspecified: Secondary | ICD-10-CM | POA: Diagnosis not present

## 2018-04-27 DIAGNOSIS — Z79899 Other long term (current) drug therapy: Secondary | ICD-10-CM | POA: Diagnosis not present

## 2018-05-05 DIAGNOSIS — M2022 Hallux rigidus, left foot: Secondary | ICD-10-CM | POA: Diagnosis not present

## 2018-05-05 DIAGNOSIS — Z9889 Other specified postprocedural states: Secondary | ICD-10-CM | POA: Diagnosis not present

## 2018-05-18 DIAGNOSIS — M7918 Myalgia, other site: Secondary | ICD-10-CM | POA: Diagnosis not present

## 2018-05-18 DIAGNOSIS — M546 Pain in thoracic spine: Secondary | ICD-10-CM | POA: Diagnosis not present

## 2018-05-18 DIAGNOSIS — M9903 Segmental and somatic dysfunction of lumbar region: Secondary | ICD-10-CM | POA: Diagnosis not present

## 2018-05-18 DIAGNOSIS — M545 Low back pain: Secondary | ICD-10-CM | POA: Diagnosis not present

## 2018-06-02 DIAGNOSIS — M2022 Hallux rigidus, left foot: Secondary | ICD-10-CM | POA: Diagnosis not present

## 2018-06-02 DIAGNOSIS — Z9889 Other specified postprocedural states: Secondary | ICD-10-CM | POA: Diagnosis not present

## 2018-06-09 DIAGNOSIS — M65872 Other synovitis and tenosynovitis, left ankle and foot: Secondary | ICD-10-CM | POA: Diagnosis not present

## 2018-07-25 DIAGNOSIS — M7918 Myalgia, other site: Secondary | ICD-10-CM | POA: Diagnosis not present

## 2018-07-25 DIAGNOSIS — M545 Low back pain: Secondary | ICD-10-CM | POA: Diagnosis not present

## 2018-07-25 DIAGNOSIS — M546 Pain in thoracic spine: Secondary | ICD-10-CM | POA: Diagnosis not present

## 2018-07-25 DIAGNOSIS — M9903 Segmental and somatic dysfunction of lumbar region: Secondary | ICD-10-CM | POA: Diagnosis not present

## 2018-07-27 DIAGNOSIS — M546 Pain in thoracic spine: Secondary | ICD-10-CM | POA: Diagnosis not present

## 2018-07-27 DIAGNOSIS — M545 Low back pain: Secondary | ICD-10-CM | POA: Diagnosis not present

## 2018-07-27 DIAGNOSIS — M7918 Myalgia, other site: Secondary | ICD-10-CM | POA: Diagnosis not present

## 2018-07-27 DIAGNOSIS — M9903 Segmental and somatic dysfunction of lumbar region: Secondary | ICD-10-CM | POA: Diagnosis not present

## 2018-07-31 DIAGNOSIS — M546 Pain in thoracic spine: Secondary | ICD-10-CM | POA: Diagnosis not present

## 2018-07-31 DIAGNOSIS — M7918 Myalgia, other site: Secondary | ICD-10-CM | POA: Diagnosis not present

## 2018-07-31 DIAGNOSIS — M9903 Segmental and somatic dysfunction of lumbar region: Secondary | ICD-10-CM | POA: Diagnosis not present

## 2018-07-31 DIAGNOSIS — M545 Low back pain: Secondary | ICD-10-CM | POA: Diagnosis not present

## 2018-08-06 DIAGNOSIS — M546 Pain in thoracic spine: Secondary | ICD-10-CM | POA: Diagnosis not present

## 2018-08-06 DIAGNOSIS — M9903 Segmental and somatic dysfunction of lumbar region: Secondary | ICD-10-CM | POA: Diagnosis not present

## 2018-08-06 DIAGNOSIS — M545 Low back pain: Secondary | ICD-10-CM | POA: Diagnosis not present

## 2018-08-24 DIAGNOSIS — M9903 Segmental and somatic dysfunction of lumbar region: Secondary | ICD-10-CM | POA: Diagnosis not present

## 2018-08-24 DIAGNOSIS — M545 Low back pain: Secondary | ICD-10-CM | POA: Diagnosis not present

## 2018-08-24 DIAGNOSIS — M7918 Myalgia, other site: Secondary | ICD-10-CM | POA: Diagnosis not present

## 2018-08-24 DIAGNOSIS — M546 Pain in thoracic spine: Secondary | ICD-10-CM | POA: Diagnosis not present

## 2018-09-19 DIAGNOSIS — M9903 Segmental and somatic dysfunction of lumbar region: Secondary | ICD-10-CM | POA: Diagnosis not present

## 2018-09-19 DIAGNOSIS — M545 Low back pain: Secondary | ICD-10-CM | POA: Diagnosis not present

## 2018-09-19 DIAGNOSIS — M7918 Myalgia, other site: Secondary | ICD-10-CM | POA: Diagnosis not present

## 2018-09-19 DIAGNOSIS — M546 Pain in thoracic spine: Secondary | ICD-10-CM | POA: Diagnosis not present

## 2018-10-20 DIAGNOSIS — M546 Pain in thoracic spine: Secondary | ICD-10-CM | POA: Diagnosis not present

## 2018-10-20 DIAGNOSIS — M9903 Segmental and somatic dysfunction of lumbar region: Secondary | ICD-10-CM | POA: Diagnosis not present

## 2018-10-20 DIAGNOSIS — M7918 Myalgia, other site: Secondary | ICD-10-CM | POA: Diagnosis not present

## 2018-10-20 DIAGNOSIS — M545 Low back pain: Secondary | ICD-10-CM | POA: Diagnosis not present

## 2018-10-22 DIAGNOSIS — M9903 Segmental and somatic dysfunction of lumbar region: Secondary | ICD-10-CM | POA: Diagnosis not present

## 2018-10-22 DIAGNOSIS — M545 Low back pain: Secondary | ICD-10-CM | POA: Diagnosis not present

## 2018-10-22 DIAGNOSIS — M7918 Myalgia, other site: Secondary | ICD-10-CM | POA: Diagnosis not present

## 2018-10-22 DIAGNOSIS — M546 Pain in thoracic spine: Secondary | ICD-10-CM | POA: Diagnosis not present

## 2018-11-03 DIAGNOSIS — M545 Low back pain: Secondary | ICD-10-CM | POA: Diagnosis not present

## 2018-11-03 DIAGNOSIS — M7918 Myalgia, other site: Secondary | ICD-10-CM | POA: Diagnosis not present

## 2018-11-03 DIAGNOSIS — M9903 Segmental and somatic dysfunction of lumbar region: Secondary | ICD-10-CM | POA: Diagnosis not present

## 2018-11-03 DIAGNOSIS — M546 Pain in thoracic spine: Secondary | ICD-10-CM | POA: Diagnosis not present

## 2018-11-05 DIAGNOSIS — M7918 Myalgia, other site: Secondary | ICD-10-CM | POA: Diagnosis not present

## 2018-11-05 DIAGNOSIS — M545 Low back pain: Secondary | ICD-10-CM | POA: Diagnosis not present

## 2018-11-05 DIAGNOSIS — M546 Pain in thoracic spine: Secondary | ICD-10-CM | POA: Diagnosis not present

## 2018-12-10 DIAGNOSIS — M546 Pain in thoracic spine: Secondary | ICD-10-CM | POA: Diagnosis not present

## 2018-12-10 DIAGNOSIS — M9903 Segmental and somatic dysfunction of lumbar region: Secondary | ICD-10-CM | POA: Diagnosis not present

## 2018-12-10 DIAGNOSIS — M545 Low back pain: Secondary | ICD-10-CM | POA: Diagnosis not present

## 2018-12-10 DIAGNOSIS — M7918 Myalgia, other site: Secondary | ICD-10-CM | POA: Diagnosis not present

## 2018-12-12 DIAGNOSIS — M7918 Myalgia, other site: Secondary | ICD-10-CM | POA: Diagnosis not present

## 2018-12-12 DIAGNOSIS — M9903 Segmental and somatic dysfunction of lumbar region: Secondary | ICD-10-CM | POA: Diagnosis not present

## 2018-12-12 DIAGNOSIS — M545 Low back pain: Secondary | ICD-10-CM | POA: Diagnosis not present

## 2018-12-12 DIAGNOSIS — M546 Pain in thoracic spine: Secondary | ICD-10-CM | POA: Diagnosis not present

## 2018-12-24 DIAGNOSIS — M9903 Segmental and somatic dysfunction of lumbar region: Secondary | ICD-10-CM | POA: Diagnosis not present

## 2018-12-24 DIAGNOSIS — M7918 Myalgia, other site: Secondary | ICD-10-CM | POA: Diagnosis not present

## 2018-12-24 DIAGNOSIS — M546 Pain in thoracic spine: Secondary | ICD-10-CM | POA: Diagnosis not present

## 2018-12-24 DIAGNOSIS — M545 Low back pain: Secondary | ICD-10-CM | POA: Diagnosis not present

## 2018-12-29 DIAGNOSIS — M9903 Segmental and somatic dysfunction of lumbar region: Secondary | ICD-10-CM | POA: Diagnosis not present

## 2018-12-29 DIAGNOSIS — M545 Low back pain: Secondary | ICD-10-CM | POA: Diagnosis not present

## 2018-12-29 DIAGNOSIS — M7918 Myalgia, other site: Secondary | ICD-10-CM | POA: Diagnosis not present

## 2018-12-29 DIAGNOSIS — M546 Pain in thoracic spine: Secondary | ICD-10-CM | POA: Diagnosis not present

## 2019-02-09 DIAGNOSIS — L648 Other androgenic alopecia: Secondary | ICD-10-CM | POA: Diagnosis not present

## 2019-02-09 DIAGNOSIS — Z0000 Encounter for general adult medical examination without abnormal findings: Secondary | ICD-10-CM | POA: Diagnosis not present

## 2019-02-09 DIAGNOSIS — Z79899 Other long term (current) drug therapy: Secondary | ICD-10-CM | POA: Diagnosis not present

## 2019-02-09 DIAGNOSIS — M179 Osteoarthritis of knee, unspecified: Secondary | ICD-10-CM | POA: Diagnosis not present

## 2019-02-09 DIAGNOSIS — Z125 Encounter for screening for malignant neoplasm of prostate: Secondary | ICD-10-CM | POA: Diagnosis not present

## 2019-02-09 DIAGNOSIS — E782 Mixed hyperlipidemia: Secondary | ICD-10-CM | POA: Diagnosis not present

## 2019-02-09 DIAGNOSIS — E559 Vitamin D deficiency, unspecified: Secondary | ICD-10-CM | POA: Diagnosis not present

## 2019-02-11 DIAGNOSIS — Z1211 Encounter for screening for malignant neoplasm of colon: Secondary | ICD-10-CM | POA: Diagnosis not present

## 2019-04-02 DIAGNOSIS — M546 Pain in thoracic spine: Secondary | ICD-10-CM | POA: Diagnosis not present

## 2019-04-02 DIAGNOSIS — M9903 Segmental and somatic dysfunction of lumbar region: Secondary | ICD-10-CM | POA: Diagnosis not present

## 2019-04-02 DIAGNOSIS — M7918 Myalgia, other site: Secondary | ICD-10-CM | POA: Diagnosis not present

## 2019-04-02 DIAGNOSIS — M545 Low back pain: Secondary | ICD-10-CM | POA: Diagnosis not present

## 2019-04-08 DIAGNOSIS — M546 Pain in thoracic spine: Secondary | ICD-10-CM | POA: Diagnosis not present

## 2019-04-08 DIAGNOSIS — M9903 Segmental and somatic dysfunction of lumbar region: Secondary | ICD-10-CM | POA: Diagnosis not present

## 2019-04-08 DIAGNOSIS — M545 Low back pain: Secondary | ICD-10-CM | POA: Diagnosis not present

## 2019-04-08 DIAGNOSIS — M7918 Myalgia, other site: Secondary | ICD-10-CM | POA: Diagnosis not present

## 2019-04-10 DIAGNOSIS — M546 Pain in thoracic spine: Secondary | ICD-10-CM | POA: Diagnosis not present

## 2019-04-10 DIAGNOSIS — M9903 Segmental and somatic dysfunction of lumbar region: Secondary | ICD-10-CM | POA: Diagnosis not present

## 2019-04-10 DIAGNOSIS — M25552 Pain in left hip: Secondary | ICD-10-CM | POA: Diagnosis not present

## 2019-04-10 DIAGNOSIS — M7918 Myalgia, other site: Secondary | ICD-10-CM | POA: Diagnosis not present

## 2019-04-10 DIAGNOSIS — M545 Low back pain: Secondary | ICD-10-CM | POA: Diagnosis not present

## 2019-04-17 DIAGNOSIS — M9903 Segmental and somatic dysfunction of lumbar region: Secondary | ICD-10-CM | POA: Diagnosis not present

## 2019-04-17 DIAGNOSIS — M545 Low back pain: Secondary | ICD-10-CM | POA: Diagnosis not present

## 2019-04-17 DIAGNOSIS — M7918 Myalgia, other site: Secondary | ICD-10-CM | POA: Diagnosis not present

## 2019-04-17 DIAGNOSIS — M546 Pain in thoracic spine: Secondary | ICD-10-CM | POA: Diagnosis not present

## 2019-04-17 DIAGNOSIS — M25552 Pain in left hip: Secondary | ICD-10-CM | POA: Diagnosis not present

## 2019-04-20 DIAGNOSIS — M7918 Myalgia, other site: Secondary | ICD-10-CM | POA: Diagnosis not present

## 2019-04-20 DIAGNOSIS — M545 Low back pain: Secondary | ICD-10-CM | POA: Diagnosis not present

## 2019-04-20 DIAGNOSIS — M9903 Segmental and somatic dysfunction of lumbar region: Secondary | ICD-10-CM | POA: Diagnosis not present

## 2019-04-20 DIAGNOSIS — M25552 Pain in left hip: Secondary | ICD-10-CM | POA: Diagnosis not present

## 2019-04-20 DIAGNOSIS — M546 Pain in thoracic spine: Secondary | ICD-10-CM | POA: Diagnosis not present

## 2019-04-22 DIAGNOSIS — M25552 Pain in left hip: Secondary | ICD-10-CM | POA: Diagnosis not present

## 2019-04-22 DIAGNOSIS — M9903 Segmental and somatic dysfunction of lumbar region: Secondary | ICD-10-CM | POA: Diagnosis not present

## 2019-04-22 DIAGNOSIS — M7918 Myalgia, other site: Secondary | ICD-10-CM | POA: Diagnosis not present

## 2019-04-22 DIAGNOSIS — M546 Pain in thoracic spine: Secondary | ICD-10-CM | POA: Diagnosis not present

## 2019-04-22 DIAGNOSIS — M545 Low back pain: Secondary | ICD-10-CM | POA: Diagnosis not present

## 2019-04-24 DIAGNOSIS — M546 Pain in thoracic spine: Secondary | ICD-10-CM | POA: Diagnosis not present

## 2019-04-24 DIAGNOSIS — M545 Low back pain: Secondary | ICD-10-CM | POA: Diagnosis not present

## 2019-04-24 DIAGNOSIS — M9903 Segmental and somatic dysfunction of lumbar region: Secondary | ICD-10-CM | POA: Diagnosis not present

## 2019-04-24 DIAGNOSIS — M25552 Pain in left hip: Secondary | ICD-10-CM | POA: Diagnosis not present

## 2019-04-24 DIAGNOSIS — M7918 Myalgia, other site: Secondary | ICD-10-CM | POA: Diagnosis not present

## 2019-09-08 DIAGNOSIS — M720 Palmar fascial fibromatosis [Dupuytren]: Secondary | ICD-10-CM | POA: Diagnosis not present

## 2019-09-16 DIAGNOSIS — M7742 Metatarsalgia, left foot: Secondary | ICD-10-CM | POA: Diagnosis not present

## 2019-09-16 DIAGNOSIS — M792 Neuralgia and neuritis, unspecified: Secondary | ICD-10-CM | POA: Diagnosis not present

## 2019-10-07 DIAGNOSIS — M792 Neuralgia and neuritis, unspecified: Secondary | ICD-10-CM | POA: Diagnosis not present

## 2019-10-07 DIAGNOSIS — M7742 Metatarsalgia, left foot: Secondary | ICD-10-CM | POA: Diagnosis not present

## 2019-11-11 DIAGNOSIS — Z23 Encounter for immunization: Secondary | ICD-10-CM | POA: Diagnosis not present

## 2019-11-13 DIAGNOSIS — M7918 Myalgia, other site: Secondary | ICD-10-CM | POA: Diagnosis not present

## 2019-11-13 DIAGNOSIS — M25552 Pain in left hip: Secondary | ICD-10-CM | POA: Diagnosis not present

## 2019-11-13 DIAGNOSIS — M545 Low back pain: Secondary | ICD-10-CM | POA: Diagnosis not present

## 2019-11-13 DIAGNOSIS — M9903 Segmental and somatic dysfunction of lumbar region: Secondary | ICD-10-CM | POA: Diagnosis not present

## 2019-11-13 DIAGNOSIS — M546 Pain in thoracic spine: Secondary | ICD-10-CM | POA: Diagnosis not present

## 2019-11-16 DIAGNOSIS — M9903 Segmental and somatic dysfunction of lumbar region: Secondary | ICD-10-CM | POA: Diagnosis not present

## 2019-11-16 DIAGNOSIS — M7918 Myalgia, other site: Secondary | ICD-10-CM | POA: Diagnosis not present

## 2019-11-16 DIAGNOSIS — M545 Low back pain: Secondary | ICD-10-CM | POA: Diagnosis not present

## 2019-11-16 DIAGNOSIS — M25552 Pain in left hip: Secondary | ICD-10-CM | POA: Diagnosis not present

## 2019-11-16 DIAGNOSIS — M546 Pain in thoracic spine: Secondary | ICD-10-CM | POA: Diagnosis not present

## 2019-11-17 DIAGNOSIS — L408 Other psoriasis: Secondary | ICD-10-CM | POA: Diagnosis not present

## 2019-11-18 DIAGNOSIS — M546 Pain in thoracic spine: Secondary | ICD-10-CM | POA: Diagnosis not present

## 2019-11-18 DIAGNOSIS — M9903 Segmental and somatic dysfunction of lumbar region: Secondary | ICD-10-CM | POA: Diagnosis not present

## 2019-11-18 DIAGNOSIS — M25552 Pain in left hip: Secondary | ICD-10-CM | POA: Diagnosis not present

## 2019-11-18 DIAGNOSIS — M7918 Myalgia, other site: Secondary | ICD-10-CM | POA: Diagnosis not present

## 2019-11-18 DIAGNOSIS — M545 Low back pain: Secondary | ICD-10-CM | POA: Diagnosis not present

## 2020-01-11 DIAGNOSIS — M7742 Metatarsalgia, left foot: Secondary | ICD-10-CM | POA: Diagnosis not present

## 2020-01-11 DIAGNOSIS — M792 Neuralgia and neuritis, unspecified: Secondary | ICD-10-CM | POA: Diagnosis not present

## 2020-01-26 DIAGNOSIS — M7742 Metatarsalgia, left foot: Secondary | ICD-10-CM | POA: Diagnosis not present

## 2020-01-26 DIAGNOSIS — M792 Neuralgia and neuritis, unspecified: Secondary | ICD-10-CM | POA: Diagnosis not present

## 2020-01-28 DIAGNOSIS — R600 Localized edema: Secondary | ICD-10-CM | POA: Diagnosis not present

## 2020-01-28 DIAGNOSIS — Z9889 Other specified postprocedural states: Secondary | ICD-10-CM | POA: Diagnosis not present

## 2020-01-28 DIAGNOSIS — M79672 Pain in left foot: Secondary | ICD-10-CM | POA: Diagnosis not present

## 2020-01-28 DIAGNOSIS — M792 Neuralgia and neuritis, unspecified: Secondary | ICD-10-CM | POA: Diagnosis not present

## 2020-01-31 DIAGNOSIS — Z79899 Other long term (current) drug therapy: Secondary | ICD-10-CM | POA: Diagnosis not present

## 2020-01-31 DIAGNOSIS — L400 Psoriasis vulgaris: Secondary | ICD-10-CM | POA: Diagnosis not present

## 2020-02-03 DIAGNOSIS — M7742 Metatarsalgia, left foot: Secondary | ICD-10-CM | POA: Diagnosis not present

## 2020-02-03 DIAGNOSIS — M792 Neuralgia and neuritis, unspecified: Secondary | ICD-10-CM | POA: Diagnosis not present

## 2020-02-15 DIAGNOSIS — G5762 Lesion of plantar nerve, left lower limb: Secondary | ICD-10-CM | POA: Diagnosis not present

## 2020-02-15 DIAGNOSIS — R202 Paresthesia of skin: Secondary | ICD-10-CM | POA: Diagnosis not present

## 2020-02-15 DIAGNOSIS — M65179 Other infective (teno)synovitis, unspecified ankle and foot: Secondary | ICD-10-CM | POA: Diagnosis not present

## 2020-02-15 DIAGNOSIS — M2022 Hallux rigidus, left foot: Secondary | ICD-10-CM | POA: Diagnosis not present

## 2020-03-02 DIAGNOSIS — M65179 Other infective (teno)synovitis, unspecified ankle and foot: Secondary | ICD-10-CM | POA: Diagnosis not present

## 2020-03-08 DIAGNOSIS — M530 Cervicocranial syndrome: Secondary | ICD-10-CM | POA: Diagnosis not present

## 2020-03-08 DIAGNOSIS — M65179 Other infective (teno)synovitis, unspecified ankle and foot: Secondary | ICD-10-CM | POA: Diagnosis not present

## 2020-03-15 DIAGNOSIS — L400 Psoriasis vulgaris: Secondary | ICD-10-CM | POA: Diagnosis not present

## 2020-03-22 DIAGNOSIS — M542 Cervicalgia: Secondary | ICD-10-CM | POA: Diagnosis not present

## 2020-03-22 DIAGNOSIS — M530 Cervicocranial syndrome: Secondary | ICD-10-CM | POA: Diagnosis not present

## 2020-04-07 DIAGNOSIS — M545 Low back pain: Secondary | ICD-10-CM | POA: Diagnosis not present

## 2020-04-07 DIAGNOSIS — M25552 Pain in left hip: Secondary | ICD-10-CM | POA: Diagnosis not present

## 2020-04-07 DIAGNOSIS — M9903 Segmental and somatic dysfunction of lumbar region: Secondary | ICD-10-CM | POA: Diagnosis not present

## 2020-04-07 DIAGNOSIS — M546 Pain in thoracic spine: Secondary | ICD-10-CM | POA: Diagnosis not present

## 2020-04-07 DIAGNOSIS — M7918 Myalgia, other site: Secondary | ICD-10-CM | POA: Diagnosis not present

## 2020-04-18 DIAGNOSIS — M2022 Hallux rigidus, left foot: Secondary | ICD-10-CM | POA: Diagnosis not present

## 2020-04-18 DIAGNOSIS — M65179 Other infective (teno)synovitis, unspecified ankle and foot: Secondary | ICD-10-CM | POA: Diagnosis not present

## 2020-04-18 DIAGNOSIS — G5762 Lesion of plantar nerve, left lower limb: Secondary | ICD-10-CM | POA: Diagnosis not present

## 2020-04-18 DIAGNOSIS — R202 Paresthesia of skin: Secondary | ICD-10-CM | POA: Diagnosis not present

## 2020-04-20 DIAGNOSIS — M546 Pain in thoracic spine: Secondary | ICD-10-CM | POA: Diagnosis not present

## 2020-04-20 DIAGNOSIS — M62838 Other muscle spasm: Secondary | ICD-10-CM | POA: Diagnosis not present

## 2020-04-20 DIAGNOSIS — M545 Low back pain: Secondary | ICD-10-CM | POA: Diagnosis not present

## 2020-04-20 DIAGNOSIS — M7918 Myalgia, other site: Secondary | ICD-10-CM | POA: Diagnosis not present

## 2020-04-20 DIAGNOSIS — M542 Cervicalgia: Secondary | ICD-10-CM | POA: Diagnosis not present

## 2020-04-20 DIAGNOSIS — M25552 Pain in left hip: Secondary | ICD-10-CM | POA: Diagnosis not present

## 2020-04-20 DIAGNOSIS — G4489 Other headache syndrome: Secondary | ICD-10-CM | POA: Diagnosis not present

## 2020-04-20 DIAGNOSIS — M9903 Segmental and somatic dysfunction of lumbar region: Secondary | ICD-10-CM | POA: Diagnosis not present

## 2020-04-20 DIAGNOSIS — M530 Cervicocranial syndrome: Secondary | ICD-10-CM | POA: Diagnosis not present

## 2020-04-28 DIAGNOSIS — M25552 Pain in left hip: Secondary | ICD-10-CM | POA: Diagnosis not present

## 2020-04-28 DIAGNOSIS — Z03818 Encounter for observation for suspected exposure to other biological agents ruled out: Secondary | ICD-10-CM | POA: Diagnosis not present

## 2020-04-28 DIAGNOSIS — M545 Low back pain: Secondary | ICD-10-CM | POA: Diagnosis not present

## 2020-04-28 DIAGNOSIS — M9903 Segmental and somatic dysfunction of lumbar region: Secondary | ICD-10-CM | POA: Diagnosis not present

## 2020-04-28 DIAGNOSIS — Z20828 Contact with and (suspected) exposure to other viral communicable diseases: Secondary | ICD-10-CM | POA: Diagnosis not present

## 2020-04-28 DIAGNOSIS — M7918 Myalgia, other site: Secondary | ICD-10-CM | POA: Diagnosis not present

## 2020-04-28 DIAGNOSIS — M546 Pain in thoracic spine: Secondary | ICD-10-CM | POA: Diagnosis not present

## 2020-04-29 DIAGNOSIS — M546 Pain in thoracic spine: Secondary | ICD-10-CM | POA: Diagnosis not present

## 2020-04-29 DIAGNOSIS — M9903 Segmental and somatic dysfunction of lumbar region: Secondary | ICD-10-CM | POA: Diagnosis not present

## 2020-04-29 DIAGNOSIS — M7918 Myalgia, other site: Secondary | ICD-10-CM | POA: Diagnosis not present

## 2020-04-29 DIAGNOSIS — M25552 Pain in left hip: Secondary | ICD-10-CM | POA: Diagnosis not present

## 2020-04-29 DIAGNOSIS — M545 Low back pain: Secondary | ICD-10-CM | POA: Diagnosis not present

## 2020-05-25 DIAGNOSIS — L400 Psoriasis vulgaris: Secondary | ICD-10-CM | POA: Diagnosis not present

## 2020-06-05 DIAGNOSIS — L400 Psoriasis vulgaris: Secondary | ICD-10-CM | POA: Diagnosis not present

## 2020-06-19 DIAGNOSIS — N5082 Scrotal pain: Secondary | ICD-10-CM | POA: Diagnosis not present

## 2020-06-20 DIAGNOSIS — M2022 Hallux rigidus, left foot: Secondary | ICD-10-CM | POA: Diagnosis not present

## 2020-06-20 DIAGNOSIS — M65179 Other infective (teno)synovitis, unspecified ankle and foot: Secondary | ICD-10-CM | POA: Diagnosis not present

## 2020-06-20 DIAGNOSIS — R202 Paresthesia of skin: Secondary | ICD-10-CM | POA: Diagnosis not present

## 2020-06-20 DIAGNOSIS — G5762 Lesion of plantar nerve, left lower limb: Secondary | ICD-10-CM | POA: Diagnosis not present

## 2020-07-07 DIAGNOSIS — Z20822 Contact with and (suspected) exposure to covid-19: Secondary | ICD-10-CM | POA: Diagnosis not present

## 2020-10-02 DIAGNOSIS — M65179 Other infective (teno)synovitis, unspecified ankle and foot: Secondary | ICD-10-CM | POA: Diagnosis not present

## 2020-10-06 DIAGNOSIS — H25013 Cortical age-related cataract, bilateral: Secondary | ICD-10-CM | POA: Diagnosis not present

## 2020-10-07 DIAGNOSIS — Z20822 Contact with and (suspected) exposure to covid-19: Secondary | ICD-10-CM | POA: Diagnosis not present

## 2020-10-19 DIAGNOSIS — Z23 Encounter for immunization: Secondary | ICD-10-CM | POA: Diagnosis not present

## 2020-12-13 DIAGNOSIS — M65179 Other infective (teno)synovitis, unspecified ankle and foot: Secondary | ICD-10-CM | POA: Diagnosis not present

## 2020-12-13 DIAGNOSIS — G5762 Lesion of plantar nerve, left lower limb: Secondary | ICD-10-CM | POA: Diagnosis not present

## 2021-01-02 DIAGNOSIS — H25012 Cortical age-related cataract, left eye: Secondary | ICD-10-CM | POA: Diagnosis not present

## 2021-01-08 DIAGNOSIS — L400 Psoriasis vulgaris: Secondary | ICD-10-CM | POA: Diagnosis not present

## 2021-01-08 DIAGNOSIS — Z79899 Other long term (current) drug therapy: Secondary | ICD-10-CM | POA: Diagnosis not present

## 2021-01-09 DIAGNOSIS — H25012 Cortical age-related cataract, left eye: Secondary | ICD-10-CM | POA: Diagnosis not present

## 2021-01-09 DIAGNOSIS — H269 Unspecified cataract: Secondary | ICD-10-CM | POA: Diagnosis not present

## 2021-01-16 DIAGNOSIS — H25011 Cortical age-related cataract, right eye: Secondary | ICD-10-CM | POA: Diagnosis not present

## 2021-01-16 DIAGNOSIS — H25811 Combined forms of age-related cataract, right eye: Secondary | ICD-10-CM | POA: Diagnosis not present

## 2021-01-16 DIAGNOSIS — M546 Pain in thoracic spine: Secondary | ICD-10-CM | POA: Diagnosis not present

## 2021-01-16 DIAGNOSIS — M7918 Myalgia, other site: Secondary | ICD-10-CM | POA: Diagnosis not present

## 2021-01-16 DIAGNOSIS — M9903 Segmental and somatic dysfunction of lumbar region: Secondary | ICD-10-CM | POA: Diagnosis not present

## 2021-01-16 DIAGNOSIS — M5451 Vertebrogenic low back pain: Secondary | ICD-10-CM | POA: Diagnosis not present

## 2021-01-16 DIAGNOSIS — M25552 Pain in left hip: Secondary | ICD-10-CM | POA: Diagnosis not present

## 2021-01-16 DIAGNOSIS — Z961 Presence of intraocular lens: Secondary | ICD-10-CM | POA: Diagnosis not present

## 2021-01-18 DIAGNOSIS — M25552 Pain in left hip: Secondary | ICD-10-CM | POA: Diagnosis not present

## 2021-01-18 DIAGNOSIS — M9903 Segmental and somatic dysfunction of lumbar region: Secondary | ICD-10-CM | POA: Diagnosis not present

## 2021-01-18 DIAGNOSIS — M546 Pain in thoracic spine: Secondary | ICD-10-CM | POA: Diagnosis not present

## 2021-01-18 DIAGNOSIS — M7918 Myalgia, other site: Secondary | ICD-10-CM | POA: Diagnosis not present

## 2021-01-18 DIAGNOSIS — M5451 Vertebrogenic low back pain: Secondary | ICD-10-CM | POA: Diagnosis not present

## 2021-01-23 DIAGNOSIS — H269 Unspecified cataract: Secondary | ICD-10-CM | POA: Diagnosis not present

## 2021-01-23 DIAGNOSIS — H25011 Cortical age-related cataract, right eye: Secondary | ICD-10-CM | POA: Diagnosis not present

## 2021-01-23 DIAGNOSIS — F419 Anxiety disorder, unspecified: Secondary | ICD-10-CM | POA: Diagnosis not present

## 2021-01-23 DIAGNOSIS — H25811 Combined forms of age-related cataract, right eye: Secondary | ICD-10-CM | POA: Diagnosis not present

## 2021-01-23 DIAGNOSIS — E785 Hyperlipidemia, unspecified: Secondary | ICD-10-CM | POA: Diagnosis not present

## 2021-01-23 DIAGNOSIS — Z961 Presence of intraocular lens: Secondary | ICD-10-CM | POA: Diagnosis not present

## 2021-01-23 DIAGNOSIS — H5201 Hypermetropia, right eye: Secondary | ICD-10-CM | POA: Diagnosis not present

## 2021-01-25 DIAGNOSIS — Z23 Encounter for immunization: Secondary | ICD-10-CM | POA: Diagnosis not present

## 2021-01-30 DIAGNOSIS — M25552 Pain in left hip: Secondary | ICD-10-CM | POA: Diagnosis not present

## 2021-01-30 DIAGNOSIS — M5451 Vertebrogenic low back pain: Secondary | ICD-10-CM | POA: Diagnosis not present

## 2021-01-30 DIAGNOSIS — M7918 Myalgia, other site: Secondary | ICD-10-CM | POA: Diagnosis not present

## 2021-01-30 DIAGNOSIS — M546 Pain in thoracic spine: Secondary | ICD-10-CM | POA: Diagnosis not present

## 2021-01-30 DIAGNOSIS — M9903 Segmental and somatic dysfunction of lumbar region: Secondary | ICD-10-CM | POA: Diagnosis not present

## 2021-01-31 DIAGNOSIS — M5451 Vertebrogenic low back pain: Secondary | ICD-10-CM | POA: Diagnosis not present

## 2021-01-31 DIAGNOSIS — M7918 Myalgia, other site: Secondary | ICD-10-CM | POA: Diagnosis not present

## 2021-01-31 DIAGNOSIS — M25552 Pain in left hip: Secondary | ICD-10-CM | POA: Diagnosis not present

## 2021-01-31 DIAGNOSIS — M9903 Segmental and somatic dysfunction of lumbar region: Secondary | ICD-10-CM | POA: Diagnosis not present

## 2021-01-31 DIAGNOSIS — M546 Pain in thoracic spine: Secondary | ICD-10-CM | POA: Diagnosis not present

## 2021-02-15 DIAGNOSIS — M5451 Vertebrogenic low back pain: Secondary | ICD-10-CM | POA: Diagnosis not present

## 2021-02-15 DIAGNOSIS — M7918 Myalgia, other site: Secondary | ICD-10-CM | POA: Diagnosis not present

## 2021-02-15 DIAGNOSIS — M9903 Segmental and somatic dysfunction of lumbar region: Secondary | ICD-10-CM | POA: Diagnosis not present

## 2021-02-15 DIAGNOSIS — M546 Pain in thoracic spine: Secondary | ICD-10-CM | POA: Diagnosis not present

## 2021-02-15 DIAGNOSIS — M25552 Pain in left hip: Secondary | ICD-10-CM | POA: Diagnosis not present

## 2021-02-17 DIAGNOSIS — M9903 Segmental and somatic dysfunction of lumbar region: Secondary | ICD-10-CM | POA: Diagnosis not present

## 2021-02-17 DIAGNOSIS — M546 Pain in thoracic spine: Secondary | ICD-10-CM | POA: Diagnosis not present

## 2021-02-17 DIAGNOSIS — M25552 Pain in left hip: Secondary | ICD-10-CM | POA: Diagnosis not present

## 2021-02-17 DIAGNOSIS — M7918 Myalgia, other site: Secondary | ICD-10-CM | POA: Diagnosis not present

## 2021-02-17 DIAGNOSIS — M5451 Vertebrogenic low back pain: Secondary | ICD-10-CM | POA: Diagnosis not present

## 2021-02-19 DIAGNOSIS — M9903 Segmental and somatic dysfunction of lumbar region: Secondary | ICD-10-CM | POA: Diagnosis not present

## 2021-02-19 DIAGNOSIS — M546 Pain in thoracic spine: Secondary | ICD-10-CM | POA: Diagnosis not present

## 2021-02-19 DIAGNOSIS — M7918 Myalgia, other site: Secondary | ICD-10-CM | POA: Diagnosis not present

## 2021-02-19 DIAGNOSIS — M25552 Pain in left hip: Secondary | ICD-10-CM | POA: Diagnosis not present

## 2021-02-19 DIAGNOSIS — M5451 Vertebrogenic low back pain: Secondary | ICD-10-CM | POA: Diagnosis not present

## 2021-03-07 DIAGNOSIS — M25511 Pain in right shoulder: Secondary | ICD-10-CM | POA: Diagnosis not present

## 2021-03-07 DIAGNOSIS — M6240 Contracture of muscle, unspecified site: Secondary | ICD-10-CM | POA: Diagnosis not present

## 2021-04-10 DIAGNOSIS — M170 Bilateral primary osteoarthritis of knee: Secondary | ICD-10-CM | POA: Diagnosis not present

## 2021-04-10 DIAGNOSIS — G8929 Other chronic pain: Secondary | ICD-10-CM | POA: Diagnosis not present

## 2021-04-10 DIAGNOSIS — M25561 Pain in right knee: Secondary | ICD-10-CM | POA: Diagnosis not present

## 2021-04-10 DIAGNOSIS — M25562 Pain in left knee: Secondary | ICD-10-CM | POA: Diagnosis not present

## 2021-04-25 DIAGNOSIS — M25511 Pain in right shoulder: Secondary | ICD-10-CM | POA: Diagnosis not present

## 2021-04-25 DIAGNOSIS — M6240 Contracture of muscle, unspecified site: Secondary | ICD-10-CM | POA: Diagnosis not present

## 2021-05-02 DIAGNOSIS — M25511 Pain in right shoulder: Secondary | ICD-10-CM | POA: Diagnosis not present

## 2021-05-02 DIAGNOSIS — M6240 Contracture of muscle, unspecified site: Secondary | ICD-10-CM | POA: Diagnosis not present

## 2021-05-08 DIAGNOSIS — M6240 Contracture of muscle, unspecified site: Secondary | ICD-10-CM | POA: Diagnosis not present

## 2021-05-08 DIAGNOSIS — M25511 Pain in right shoulder: Secondary | ICD-10-CM | POA: Diagnosis not present

## 2021-05-09 DIAGNOSIS — L400 Psoriasis vulgaris: Secondary | ICD-10-CM | POA: Diagnosis not present

## 2021-05-29 DIAGNOSIS — M6240 Contracture of muscle, unspecified site: Secondary | ICD-10-CM | POA: Diagnosis not present

## 2021-05-29 DIAGNOSIS — M25511 Pain in right shoulder: Secondary | ICD-10-CM | POA: Diagnosis not present

## 2021-06-01 DIAGNOSIS — M25511 Pain in right shoulder: Secondary | ICD-10-CM | POA: Diagnosis not present

## 2021-06-01 DIAGNOSIS — M6240 Contracture of muscle, unspecified site: Secondary | ICD-10-CM | POA: Diagnosis not present

## 2021-10-15 DIAGNOSIS — M79672 Pain in left foot: Secondary | ICD-10-CM | POA: Diagnosis not present

## 2021-10-15 DIAGNOSIS — M65179 Other infective (teno)synovitis, unspecified ankle and foot: Secondary | ICD-10-CM | POA: Diagnosis not present

## 2021-10-17 DIAGNOSIS — G8929 Other chronic pain: Secondary | ICD-10-CM | POA: Diagnosis not present

## 2021-10-17 DIAGNOSIS — M25561 Pain in right knee: Secondary | ICD-10-CM | POA: Diagnosis not present

## 2021-10-17 DIAGNOSIS — M7500 Adhesive capsulitis of unspecified shoulder: Secondary | ICD-10-CM | POA: Diagnosis not present

## 2021-10-17 DIAGNOSIS — M25562 Pain in left knee: Secondary | ICD-10-CM | POA: Diagnosis not present

## 2021-10-23 DIAGNOSIS — M25561 Pain in right knee: Secondary | ICD-10-CM | POA: Diagnosis not present

## 2021-10-23 DIAGNOSIS — G8929 Other chronic pain: Secondary | ICD-10-CM | POA: Diagnosis not present

## 2021-10-23 DIAGNOSIS — M25562 Pain in left knee: Secondary | ICD-10-CM | POA: Diagnosis not present

## 2021-10-23 DIAGNOSIS — M7500 Adhesive capsulitis of unspecified shoulder: Secondary | ICD-10-CM | POA: Diagnosis not present

## 2021-10-24 DIAGNOSIS — L400 Psoriasis vulgaris: Secondary | ICD-10-CM | POA: Diagnosis not present

## 2021-10-31 DIAGNOSIS — M7500 Adhesive capsulitis of unspecified shoulder: Secondary | ICD-10-CM | POA: Diagnosis not present

## 2021-10-31 DIAGNOSIS — G8929 Other chronic pain: Secondary | ICD-10-CM | POA: Diagnosis not present

## 2021-10-31 DIAGNOSIS — M25561 Pain in right knee: Secondary | ICD-10-CM | POA: Diagnosis not present

## 2021-10-31 DIAGNOSIS — M25562 Pain in left knee: Secondary | ICD-10-CM | POA: Diagnosis not present

## 2021-11-02 DIAGNOSIS — F332 Major depressive disorder, recurrent severe without psychotic features: Secondary | ICD-10-CM | POA: Diagnosis not present

## 2021-11-02 DIAGNOSIS — F411 Generalized anxiety disorder: Secondary | ICD-10-CM | POA: Diagnosis not present

## 2021-11-02 DIAGNOSIS — F1220 Cannabis dependence, uncomplicated: Secondary | ICD-10-CM | POA: Diagnosis not present

## 2021-11-05 DIAGNOSIS — M25562 Pain in left knee: Secondary | ICD-10-CM | POA: Diagnosis not present

## 2021-11-05 DIAGNOSIS — M25561 Pain in right knee: Secondary | ICD-10-CM | POA: Diagnosis not present

## 2021-11-05 DIAGNOSIS — G8929 Other chronic pain: Secondary | ICD-10-CM | POA: Diagnosis not present

## 2021-11-05 DIAGNOSIS — M7500 Adhesive capsulitis of unspecified shoulder: Secondary | ICD-10-CM | POA: Diagnosis not present

## 2021-11-06 DIAGNOSIS — M2241 Chondromalacia patellae, right knee: Secondary | ICD-10-CM | POA: Diagnosis not present

## 2021-11-06 DIAGNOSIS — M25562 Pain in left knee: Secondary | ICD-10-CM | POA: Diagnosis not present

## 2021-11-06 DIAGNOSIS — M2242 Chondromalacia patellae, left knee: Secondary | ICD-10-CM | POA: Diagnosis not present

## 2021-11-06 DIAGNOSIS — M25561 Pain in right knee: Secondary | ICD-10-CM | POA: Diagnosis not present

## 2021-11-07 DIAGNOSIS — M65179 Other infective (teno)synovitis, unspecified ankle and foot: Secondary | ICD-10-CM | POA: Diagnosis not present

## 2021-11-09 DIAGNOSIS — F332 Major depressive disorder, recurrent severe without psychotic features: Secondary | ICD-10-CM | POA: Diagnosis not present

## 2021-11-09 DIAGNOSIS — F1021 Alcohol dependence, in remission: Secondary | ICD-10-CM | POA: Diagnosis not present

## 2021-11-09 DIAGNOSIS — F1220 Cannabis dependence, uncomplicated: Secondary | ICD-10-CM | POA: Diagnosis not present

## 2021-11-09 DIAGNOSIS — F411 Generalized anxiety disorder: Secondary | ICD-10-CM | POA: Diagnosis not present

## 2021-11-12 DIAGNOSIS — M546 Pain in thoracic spine: Secondary | ICD-10-CM | POA: Diagnosis not present

## 2021-11-12 DIAGNOSIS — M25552 Pain in left hip: Secondary | ICD-10-CM | POA: Diagnosis not present

## 2021-11-12 DIAGNOSIS — M5451 Vertebrogenic low back pain: Secondary | ICD-10-CM | POA: Diagnosis not present

## 2021-11-12 DIAGNOSIS — M7918 Myalgia, other site: Secondary | ICD-10-CM | POA: Diagnosis not present

## 2021-11-12 DIAGNOSIS — M9903 Segmental and somatic dysfunction of lumbar region: Secondary | ICD-10-CM | POA: Diagnosis not present

## 2021-11-14 DIAGNOSIS — M7918 Myalgia, other site: Secondary | ICD-10-CM | POA: Diagnosis not present

## 2021-11-14 DIAGNOSIS — M9903 Segmental and somatic dysfunction of lumbar region: Secondary | ICD-10-CM | POA: Diagnosis not present

## 2021-11-14 DIAGNOSIS — M546 Pain in thoracic spine: Secondary | ICD-10-CM | POA: Diagnosis not present

## 2021-11-14 DIAGNOSIS — M25552 Pain in left hip: Secondary | ICD-10-CM | POA: Diagnosis not present

## 2021-11-14 DIAGNOSIS — M5451 Vertebrogenic low back pain: Secondary | ICD-10-CM | POA: Diagnosis not present

## 2021-11-16 DIAGNOSIS — M25552 Pain in left hip: Secondary | ICD-10-CM | POA: Diagnosis not present

## 2021-11-16 DIAGNOSIS — M5451 Vertebrogenic low back pain: Secondary | ICD-10-CM | POA: Diagnosis not present

## 2021-11-16 DIAGNOSIS — M9903 Segmental and somatic dysfunction of lumbar region: Secondary | ICD-10-CM | POA: Diagnosis not present

## 2021-11-16 DIAGNOSIS — M546 Pain in thoracic spine: Secondary | ICD-10-CM | POA: Diagnosis not present

## 2021-11-16 DIAGNOSIS — M7918 Myalgia, other site: Secondary | ICD-10-CM | POA: Diagnosis not present

## 2021-11-21 DIAGNOSIS — F332 Major depressive disorder, recurrent severe without psychotic features: Secondary | ICD-10-CM | POA: Diagnosis not present

## 2021-11-21 DIAGNOSIS — F1220 Cannabis dependence, uncomplicated: Secondary | ICD-10-CM | POA: Diagnosis not present

## 2021-11-21 DIAGNOSIS — F411 Generalized anxiety disorder: Secondary | ICD-10-CM | POA: Diagnosis not present

## 2021-11-21 DIAGNOSIS — F1021 Alcohol dependence, in remission: Secondary | ICD-10-CM | POA: Diagnosis not present

## 2021-11-26 DIAGNOSIS — F1220 Cannabis dependence, uncomplicated: Secondary | ICD-10-CM | POA: Diagnosis not present

## 2021-11-26 DIAGNOSIS — F332 Major depressive disorder, recurrent severe without psychotic features: Secondary | ICD-10-CM | POA: Diagnosis not present

## 2021-11-26 DIAGNOSIS — F411 Generalized anxiety disorder: Secondary | ICD-10-CM | POA: Diagnosis not present

## 2021-11-26 DIAGNOSIS — F1021 Alcohol dependence, in remission: Secondary | ICD-10-CM | POA: Diagnosis not present

## 2021-11-27 DIAGNOSIS — F332 Major depressive disorder, recurrent severe without psychotic features: Secondary | ICD-10-CM | POA: Diagnosis not present

## 2021-11-27 DIAGNOSIS — F1021 Alcohol dependence, in remission: Secondary | ICD-10-CM | POA: Diagnosis not present

## 2021-11-27 DIAGNOSIS — F411 Generalized anxiety disorder: Secondary | ICD-10-CM | POA: Diagnosis not present

## 2021-11-27 DIAGNOSIS — F1220 Cannabis dependence, uncomplicated: Secondary | ICD-10-CM | POA: Diagnosis not present

## 2021-11-28 DIAGNOSIS — F332 Major depressive disorder, recurrent severe without psychotic features: Secondary | ICD-10-CM | POA: Diagnosis not present

## 2021-11-28 DIAGNOSIS — F1021 Alcohol dependence, in remission: Secondary | ICD-10-CM | POA: Diagnosis not present

## 2021-11-28 DIAGNOSIS — F1220 Cannabis dependence, uncomplicated: Secondary | ICD-10-CM | POA: Diagnosis not present

## 2021-11-28 DIAGNOSIS — F411 Generalized anxiety disorder: Secondary | ICD-10-CM | POA: Diagnosis not present

## 2021-11-29 DIAGNOSIS — F1021 Alcohol dependence, in remission: Secondary | ICD-10-CM | POA: Diagnosis not present

## 2021-11-29 DIAGNOSIS — F332 Major depressive disorder, recurrent severe without psychotic features: Secondary | ICD-10-CM | POA: Diagnosis not present

## 2021-11-29 DIAGNOSIS — F411 Generalized anxiety disorder: Secondary | ICD-10-CM | POA: Diagnosis not present

## 2021-11-29 DIAGNOSIS — F1220 Cannabis dependence, uncomplicated: Secondary | ICD-10-CM | POA: Diagnosis not present

## 2021-11-30 DIAGNOSIS — F1021 Alcohol dependence, in remission: Secondary | ICD-10-CM | POA: Diagnosis not present

## 2021-11-30 DIAGNOSIS — F332 Major depressive disorder, recurrent severe without psychotic features: Secondary | ICD-10-CM | POA: Diagnosis not present

## 2021-11-30 DIAGNOSIS — F1220 Cannabis dependence, uncomplicated: Secondary | ICD-10-CM | POA: Diagnosis not present

## 2021-11-30 DIAGNOSIS — F411 Generalized anxiety disorder: Secondary | ICD-10-CM | POA: Diagnosis not present

## 2021-12-03 DIAGNOSIS — F1220 Cannabis dependence, uncomplicated: Secondary | ICD-10-CM | POA: Diagnosis not present

## 2021-12-03 DIAGNOSIS — F1021 Alcohol dependence, in remission: Secondary | ICD-10-CM | POA: Diagnosis not present

## 2021-12-03 DIAGNOSIS — M79672 Pain in left foot: Secondary | ICD-10-CM | POA: Diagnosis not present

## 2021-12-03 DIAGNOSIS — F332 Major depressive disorder, recurrent severe without psychotic features: Secondary | ICD-10-CM | POA: Diagnosis not present

## 2021-12-03 DIAGNOSIS — F411 Generalized anxiety disorder: Secondary | ICD-10-CM | POA: Diagnosis not present

## 2021-12-03 DIAGNOSIS — M65872 Other synovitis and tenosynovitis, left ankle and foot: Secondary | ICD-10-CM | POA: Diagnosis not present

## 2021-12-04 DIAGNOSIS — F1220 Cannabis dependence, uncomplicated: Secondary | ICD-10-CM | POA: Diagnosis not present

## 2021-12-04 DIAGNOSIS — F1021 Alcohol dependence, in remission: Secondary | ICD-10-CM | POA: Diagnosis not present

## 2021-12-04 DIAGNOSIS — F411 Generalized anxiety disorder: Secondary | ICD-10-CM | POA: Diagnosis not present

## 2021-12-04 DIAGNOSIS — F332 Major depressive disorder, recurrent severe without psychotic features: Secondary | ICD-10-CM | POA: Diagnosis not present

## 2021-12-05 DIAGNOSIS — F1021 Alcohol dependence, in remission: Secondary | ICD-10-CM | POA: Diagnosis not present

## 2021-12-05 DIAGNOSIS — F411 Generalized anxiety disorder: Secondary | ICD-10-CM | POA: Diagnosis not present

## 2021-12-05 DIAGNOSIS — F332 Major depressive disorder, recurrent severe without psychotic features: Secondary | ICD-10-CM | POA: Diagnosis not present

## 2021-12-05 DIAGNOSIS — F1220 Cannabis dependence, uncomplicated: Secondary | ICD-10-CM | POA: Diagnosis not present

## 2021-12-06 DIAGNOSIS — F411 Generalized anxiety disorder: Secondary | ICD-10-CM | POA: Diagnosis not present

## 2021-12-06 DIAGNOSIS — F1220 Cannabis dependence, uncomplicated: Secondary | ICD-10-CM | POA: Diagnosis not present

## 2021-12-06 DIAGNOSIS — F1021 Alcohol dependence, in remission: Secondary | ICD-10-CM | POA: Diagnosis not present

## 2021-12-06 DIAGNOSIS — F332 Major depressive disorder, recurrent severe without psychotic features: Secondary | ICD-10-CM | POA: Diagnosis not present

## 2021-12-07 DIAGNOSIS — F1220 Cannabis dependence, uncomplicated: Secondary | ICD-10-CM | POA: Diagnosis not present

## 2021-12-07 DIAGNOSIS — F332 Major depressive disorder, recurrent severe without psychotic features: Secondary | ICD-10-CM | POA: Diagnosis not present

## 2021-12-07 DIAGNOSIS — F411 Generalized anxiety disorder: Secondary | ICD-10-CM | POA: Diagnosis not present

## 2021-12-07 DIAGNOSIS — F1021 Alcohol dependence, in remission: Secondary | ICD-10-CM | POA: Diagnosis not present

## 2021-12-10 DIAGNOSIS — F1021 Alcohol dependence, in remission: Secondary | ICD-10-CM | POA: Diagnosis not present

## 2021-12-10 DIAGNOSIS — F1220 Cannabis dependence, uncomplicated: Secondary | ICD-10-CM | POA: Diagnosis not present

## 2021-12-10 DIAGNOSIS — F332 Major depressive disorder, recurrent severe without psychotic features: Secondary | ICD-10-CM | POA: Diagnosis not present

## 2021-12-10 DIAGNOSIS — F411 Generalized anxiety disorder: Secondary | ICD-10-CM | POA: Diagnosis not present

## 2021-12-11 DIAGNOSIS — F411 Generalized anxiety disorder: Secondary | ICD-10-CM | POA: Diagnosis not present

## 2021-12-11 DIAGNOSIS — F1220 Cannabis dependence, uncomplicated: Secondary | ICD-10-CM | POA: Diagnosis not present

## 2021-12-11 DIAGNOSIS — F1021 Alcohol dependence, in remission: Secondary | ICD-10-CM | POA: Diagnosis not present

## 2021-12-11 DIAGNOSIS — F332 Major depressive disorder, recurrent severe without psychotic features: Secondary | ICD-10-CM | POA: Diagnosis not present

## 2021-12-12 DIAGNOSIS — F411 Generalized anxiety disorder: Secondary | ICD-10-CM | POA: Diagnosis not present

## 2021-12-12 DIAGNOSIS — F1220 Cannabis dependence, uncomplicated: Secondary | ICD-10-CM | POA: Diagnosis not present

## 2021-12-12 DIAGNOSIS — F1021 Alcohol dependence, in remission: Secondary | ICD-10-CM | POA: Diagnosis not present

## 2021-12-12 DIAGNOSIS — F332 Major depressive disorder, recurrent severe without psychotic features: Secondary | ICD-10-CM | POA: Diagnosis not present

## 2021-12-13 DIAGNOSIS — F1021 Alcohol dependence, in remission: Secondary | ICD-10-CM | POA: Diagnosis not present

## 2021-12-13 DIAGNOSIS — F411 Generalized anxiety disorder: Secondary | ICD-10-CM | POA: Diagnosis not present

## 2021-12-13 DIAGNOSIS — F332 Major depressive disorder, recurrent severe without psychotic features: Secondary | ICD-10-CM | POA: Diagnosis not present

## 2021-12-13 DIAGNOSIS — F1220 Cannabis dependence, uncomplicated: Secondary | ICD-10-CM | POA: Diagnosis not present

## 2021-12-14 DIAGNOSIS — F411 Generalized anxiety disorder: Secondary | ICD-10-CM | POA: Diagnosis not present

## 2021-12-14 DIAGNOSIS — F332 Major depressive disorder, recurrent severe without psychotic features: Secondary | ICD-10-CM | POA: Diagnosis not present

## 2021-12-14 DIAGNOSIS — F1220 Cannabis dependence, uncomplicated: Secondary | ICD-10-CM | POA: Diagnosis not present

## 2021-12-14 DIAGNOSIS — F1021 Alcohol dependence, in remission: Secondary | ICD-10-CM | POA: Diagnosis not present

## 2021-12-19 DIAGNOSIS — F411 Generalized anxiety disorder: Secondary | ICD-10-CM | POA: Diagnosis not present

## 2021-12-19 DIAGNOSIS — F1021 Alcohol dependence, in remission: Secondary | ICD-10-CM | POA: Diagnosis not present

## 2021-12-19 DIAGNOSIS — F1220 Cannabis dependence, uncomplicated: Secondary | ICD-10-CM | POA: Diagnosis not present

## 2021-12-19 DIAGNOSIS — F332 Major depressive disorder, recurrent severe without psychotic features: Secondary | ICD-10-CM | POA: Diagnosis not present

## 2021-12-21 DIAGNOSIS — F1220 Cannabis dependence, uncomplicated: Secondary | ICD-10-CM | POA: Diagnosis not present

## 2021-12-21 DIAGNOSIS — F1021 Alcohol dependence, in remission: Secondary | ICD-10-CM | POA: Diagnosis not present

## 2021-12-21 DIAGNOSIS — F332 Major depressive disorder, recurrent severe without psychotic features: Secondary | ICD-10-CM | POA: Diagnosis not present

## 2021-12-21 DIAGNOSIS — F411 Generalized anxiety disorder: Secondary | ICD-10-CM | POA: Diagnosis not present
